# Patient Record
Sex: Female | Born: 1965
Health system: Southern US, Community
[De-identification: ages and names within clinical notes are randomized; demographics above are authoritative.]

## PROBLEM LIST (undated history)

## (undated) DIAGNOSIS — N390 Urinary tract infection, site not specified: Secondary | ICD-10-CM

## (undated) DIAGNOSIS — R519 Headache, unspecified: Secondary | ICD-10-CM

## (undated) DIAGNOSIS — Z8489 Family history of other specified conditions: Secondary | ICD-10-CM

## (undated) DIAGNOSIS — R51 Headache: Secondary | ICD-10-CM

## (undated) DIAGNOSIS — Z9889 Other specified postprocedural states: Secondary | ICD-10-CM

## (undated) DIAGNOSIS — R112 Nausea with vomiting, unspecified: Secondary | ICD-10-CM

## (undated) DIAGNOSIS — T4145XA Adverse effect of unspecified anesthetic, initial encounter: Secondary | ICD-10-CM

## (undated) DIAGNOSIS — T8859XA Other complications of anesthesia, initial encounter: Secondary | ICD-10-CM

## (undated) DIAGNOSIS — G610 Guillain-Barre syndrome: Secondary | ICD-10-CM

## (undated) HISTORY — DX: Guillain-Barre syndrome: G61.0

## (undated) HISTORY — DX: Urinary tract infection, site not specified: N39.0

---

## 1983-02-04 HISTORY — PX: TONSILLECTOMY: SUR1361

## 1998-06-04 DIAGNOSIS — G610 Guillain-Barre syndrome: Secondary | ICD-10-CM | POA: Insufficient documentation

## 1998-06-04 HISTORY — DX: Guillain-Barre syndrome: G61.0

## 1999-11-15 ENCOUNTER — Encounter: Payer: Self-pay | Admitting: Family Medicine

## 1999-11-15 LAB — CONVERTED CEMR LAB
Blood Glucose, Fasting: 88 mg/dL
RBC count: 4.66 10*6/uL
WBC, blood: 5.3 10*3/uL

## 1999-11-24 ENCOUNTER — Encounter: Payer: Self-pay | Admitting: Family Medicine

## 1999-11-24 LAB — CONVERTED CEMR LAB: TSH: 2.29 microintl units/mL

## 2001-04-08 ENCOUNTER — Encounter: Payer: Self-pay | Admitting: Family Medicine

## 2001-04-08 LAB — CONVERTED CEMR LAB
Blood Glucose, Fasting: 92 mg/dL
RBC count: 4.59 10*6/uL
TSH: 1.38 microintl units/mL
WBC, blood: 7.6 10*3/uL

## 2002-01-13 ENCOUNTER — Encounter: Payer: Self-pay | Admitting: Family Medicine

## 2002-01-13 LAB — CONVERTED CEMR LAB: Blood Glucose, Fasting: 93 mg/dL

## 2003-11-09 ENCOUNTER — Other Ambulatory Visit: Admission: RE | Admit: 2003-11-09 | Discharge: 2003-11-09 | Payer: Self-pay | Admitting: Family Medicine

## 2003-11-20 ENCOUNTER — Encounter: Payer: Self-pay | Admitting: Family Medicine

## 2003-11-20 LAB — CONVERTED CEMR LAB
Blood Glucose, Fasting: 94 mg/dL
TSH: 3.12 microintl units/mL

## 2003-12-29 ENCOUNTER — Ambulatory Visit: Payer: Self-pay | Admitting: Internal Medicine

## 2004-01-15 ENCOUNTER — Encounter: Payer: Self-pay | Admitting: Family Medicine

## 2004-01-15 LAB — CONVERTED CEMR LAB: Pap Smear: NORMAL

## 2004-02-08 ENCOUNTER — Ambulatory Visit: Payer: Self-pay | Admitting: Family Medicine

## 2004-02-29 ENCOUNTER — Ambulatory Visit: Payer: Self-pay | Admitting: Family Medicine

## 2004-03-15 ENCOUNTER — Ambulatory Visit: Payer: Self-pay | Admitting: Family Medicine

## 2004-03-20 ENCOUNTER — Ambulatory Visit: Payer: Self-pay | Admitting: Family Medicine

## 2004-12-25 ENCOUNTER — Ambulatory Visit: Payer: Self-pay | Admitting: Family Medicine

## 2005-03-20 ENCOUNTER — Ambulatory Visit: Payer: Self-pay | Admitting: Family Medicine

## 2005-07-25 ENCOUNTER — Ambulatory Visit: Payer: Self-pay | Admitting: Family Medicine

## 2005-07-28 ENCOUNTER — Ambulatory Visit: Payer: Self-pay | Admitting: Family Medicine

## 2005-07-28 LAB — HM MAMMOGRAPHY: HM Mammogram: NORMAL

## 2005-12-26 ENCOUNTER — Ambulatory Visit: Payer: Self-pay | Admitting: Family Medicine

## 2006-10-06 ENCOUNTER — Ambulatory Visit: Payer: Self-pay | Admitting: Family Medicine

## 2006-10-06 LAB — CONVERTED CEMR LAB
Bilirubin Urine: NEGATIVE
Glucose, Urine, Semiquant: NEGATIVE
Ketones, urine, test strip: NEGATIVE
Nitrite: NEGATIVE
Protein, U semiquant: NEGATIVE
Specific Gravity, Urine: 1.02
Urobilinogen, UA: NEGATIVE
pH: 6

## 2007-03-26 ENCOUNTER — Ambulatory Visit: Payer: Self-pay | Admitting: Family Medicine

## 2007-03-26 DIAGNOSIS — J019 Acute sinusitis, unspecified: Secondary | ICD-10-CM | POA: Insufficient documentation

## 2007-03-30 ENCOUNTER — Encounter: Payer: Self-pay | Admitting: Family Medicine

## 2007-06-30 ENCOUNTER — Ambulatory Visit: Payer: Self-pay | Admitting: Family Medicine

## 2007-06-30 DIAGNOSIS — M79609 Pain in unspecified limb: Secondary | ICD-10-CM | POA: Insufficient documentation

## 2007-06-30 LAB — CONVERTED CEMR LAB
Protein, U semiquant: 30
Specific Gravity, Urine: 1.02
pH: 5

## 2007-07-01 ENCOUNTER — Encounter: Payer: Self-pay | Admitting: Family Medicine

## 2007-11-19 ENCOUNTER — Ambulatory Visit: Payer: Self-pay | Admitting: Family Medicine

## 2007-11-19 DIAGNOSIS — E669 Obesity, unspecified: Secondary | ICD-10-CM | POA: Insufficient documentation

## 2007-11-19 DIAGNOSIS — J069 Acute upper respiratory infection, unspecified: Secondary | ICD-10-CM | POA: Insufficient documentation

## 2007-12-08 ENCOUNTER — Telehealth (INDEPENDENT_AMBULATORY_CARE_PROVIDER_SITE_OTHER): Payer: Self-pay | Admitting: Internal Medicine

## 2007-12-09 ENCOUNTER — Telehealth (INDEPENDENT_AMBULATORY_CARE_PROVIDER_SITE_OTHER): Payer: Self-pay | Admitting: *Deleted

## 2008-03-03 ENCOUNTER — Telehealth (INDEPENDENT_AMBULATORY_CARE_PROVIDER_SITE_OTHER): Payer: Self-pay | Admitting: Internal Medicine

## 2008-03-03 ENCOUNTER — Ambulatory Visit: Payer: Self-pay | Admitting: Family Medicine

## 2008-03-03 LAB — CONVERTED CEMR LAB
Bilirubin Urine: NEGATIVE
Glucose, Urine, Semiquant: NEGATIVE
Ketones, urine, test strip: NEGATIVE
Nitrite: POSITIVE
Protein, U semiquant: 30
Specific Gravity, Urine: <1.005
Urobilinogen, UA: 0.2
pH: 7

## 2008-07-24 ENCOUNTER — Ambulatory Visit: Payer: Self-pay | Admitting: Family Medicine

## 2008-07-24 LAB — CONVERTED CEMR LAB
Bilirubin Urine: NEGATIVE
Glucose, Urine, Semiquant: NEGATIVE
Ketones, urine, test strip: NEGATIVE
Nitrite: NEGATIVE
Protein, U semiquant: 30
Specific Gravity, Urine: 1.02
Urobilinogen, UA: 0.2
pH: 6

## 2008-07-27 ENCOUNTER — Telehealth: Payer: Self-pay | Admitting: Family Medicine

## 2008-08-21 ENCOUNTER — Ambulatory Visit: Payer: Self-pay | Admitting: Family Medicine

## 2008-08-21 LAB — CONVERTED CEMR LAB
Bilirubin Urine: NEGATIVE
Blood in Urine, dipstick: NEGATIVE
Glucose, Urine, Semiquant: NEGATIVE
Ketones, urine, test strip: NEGATIVE
Nitrite: NEGATIVE
Specific Gravity, Urine: <1.005
Urobilinogen, UA: 0.2
pH: 7

## 2008-08-22 ENCOUNTER — Encounter: Payer: Self-pay | Admitting: Family Medicine

## 2008-09-04 ENCOUNTER — Telehealth: Payer: Self-pay | Admitting: Family Medicine

## 2008-09-04 ENCOUNTER — Ambulatory Visit: Payer: Self-pay | Admitting: Family Medicine

## 2008-09-04 LAB — CONVERTED CEMR LAB
Bacteria, UA: 0
Bilirubin Urine: NEGATIVE
Blood in Urine, dipstick: NEGATIVE
Glucose, Urine, Semiquant: NEGATIVE
Ketones, urine, test strip: NEGATIVE
Nitrite: NEGATIVE
Protein, U semiquant: NEGATIVE
RBC / HPF: 0
Specific Gravity, Urine: 1.015
Urobilinogen, UA: 0.2
WBC Urine, dipstick: NEGATIVE
pH: 6

## 2008-09-05 ENCOUNTER — Encounter: Payer: Self-pay | Admitting: Family Medicine

## 2008-10-06 ENCOUNTER — Encounter (INDEPENDENT_AMBULATORY_CARE_PROVIDER_SITE_OTHER): Payer: Self-pay | Admitting: Internal Medicine

## 2008-10-06 ENCOUNTER — Telehealth (INDEPENDENT_AMBULATORY_CARE_PROVIDER_SITE_OTHER): Payer: Self-pay | Admitting: Internal Medicine

## 2008-10-06 LAB — CONVERTED CEMR LAB
Bilirubin Urine: NEGATIVE
Blood in Urine, dipstick: NEGATIVE
Glucose, Urine, Semiquant: NEGATIVE
Ketones, urine, test strip: NEGATIVE
Nitrite: POSITIVE
Protein, U semiquant: 30
Specific Gravity, Urine: 1.01
Urobilinogen, UA: 0.2
pH: 7

## 2008-10-07 ENCOUNTER — Encounter: Payer: Self-pay | Admitting: Family Medicine

## 2008-10-10 ENCOUNTER — Ambulatory Visit: Payer: Self-pay | Admitting: Family Medicine

## 2009-01-02 ENCOUNTER — Telehealth: Payer: Self-pay | Admitting: Family Medicine

## 2009-01-16 ENCOUNTER — Ambulatory Visit: Payer: Self-pay | Admitting: Family Medicine

## 2009-01-16 DIAGNOSIS — M25519 Pain in unspecified shoulder: Secondary | ICD-10-CM | POA: Insufficient documentation

## 2009-01-16 LAB — CONVERTED CEMR LAB
Bilirubin Urine: NEGATIVE
Glucose, Urine, Semiquant: NEGATIVE
Ketones, urine, test strip: NEGATIVE
Nitrite: NEGATIVE
Protein, U semiquant: NEGATIVE
Specific Gravity, Urine: 1.015
Urobilinogen, UA: 0.2
pH: 6

## 2009-02-06 ENCOUNTER — Telehealth: Payer: Self-pay | Admitting: Family Medicine

## 2009-03-09 ENCOUNTER — Ambulatory Visit: Payer: Self-pay | Admitting: Family Medicine

## 2009-03-09 DIAGNOSIS — J111 Influenza due to unidentified influenza virus with other respiratory manifestations: Secondary | ICD-10-CM | POA: Insufficient documentation

## 2009-03-09 LAB — CONVERTED CEMR LAB: Rapid Strep: NEGATIVE

## 2009-06-06 ENCOUNTER — Ambulatory Visit: Payer: Self-pay | Admitting: Family Medicine

## 2009-06-06 LAB — CONVERTED CEMR LAB
Bilirubin Urine: NEGATIVE
Glucose, Urine, Semiquant: NEGATIVE
Ketones, urine, test strip: NEGATIVE
Nitrite: POSITIVE
Protein, U semiquant: 100
Specific Gravity, Urine: 1.02
Urobilinogen, UA: 0.2
pH: 6

## 2009-06-07 ENCOUNTER — Encounter: Payer: Self-pay | Admitting: Family Medicine

## 2009-07-05 ENCOUNTER — Telehealth: Payer: Self-pay | Admitting: Family Medicine

## 2009-09-06 ENCOUNTER — Encounter (INDEPENDENT_AMBULATORY_CARE_PROVIDER_SITE_OTHER): Payer: Self-pay | Admitting: *Deleted

## 2009-11-09 ENCOUNTER — Ambulatory Visit: Payer: Self-pay | Admitting: Family Medicine

## 2009-11-09 DIAGNOSIS — N390 Urinary tract infection, site not specified: Secondary | ICD-10-CM | POA: Insufficient documentation

## 2009-11-09 LAB — CONVERTED CEMR LAB
Bilirubin Urine: NEGATIVE
Casts: 0 /lpf
Glucose, Urine, Semiquant: NEGATIVE
Ketones, urine, test strip: NEGATIVE
Nitrite: POSITIVE
Specific Gravity, Urine: 1.015
Urobilinogen, UA: 0.2
Yeast, UA: 0
pH: 6

## 2009-11-10 ENCOUNTER — Encounter: Payer: Self-pay | Admitting: Family Medicine

## 2010-01-16 ENCOUNTER — Telehealth: Payer: Self-pay | Admitting: Family Medicine

## 2010-03-06 NOTE — Progress Notes (Signed)
Summary: Rx Diclofenac  Phone Note Refill Request Call back at [phone] Message [facility]:  [facility] on July 05, 2009 8:14 AM  Refills Requested: Medication #1:  VOLTAREN 75 MG TBEC Take 1 tablet by mouth two times a day as needed Received e-scribe refill request.   Method Requested: Electronic Initial call taken by: Sydell Axon LPN,  July 06, 2954 8:15 AM    New/Updated Medications: VOLTAREN 75 MG TBEC (DICLOFENAC SODIUM) Take 1 tablet by mouth two times a day as needed sparingly Prescriptions: VOLTAREN 75 MG TBEC (DICLOFENAC SODIUM) Take 1 tablet by mouth two times a day as needed sparingly  #60 x 0   Entered and Authorized by:   Shaune Leeks MD   Signed by:   Shaune Leeks MD on 07/05/2009   Method used:   Electronically to        [facility] [phone]* (retail)       [address] [place], [place]  [address]       Ph: [phone] or [phone]       Fax: [phone]   RxID:   [id number]

## 2010-03-06 NOTE — Miscellaneous (Signed)
Summary: repeat culture  Clinical Lists Changes  Orders: Added new Test order of T-Culture, Urine ([id number]) - Signed Added new Service order of UA Dipstick W/ Micro (manual) (29562) - Signed

## 2010-03-06 NOTE — Progress Notes (Signed)
Summary: Voltaren  Phone Note Refill Request Message from:  [facility] on December 09, 2007 2:10 PM  Refills Requested: Medication #1:  VOLTAREN 75 MG TBEC Take 1 tablet by mouth two times a day as needed [facility] [address] [place]  Phone:   [phone]   Method Requested: Electronic Initial call taken by: Delilah Shan,  December 09, 2007 2:11 PM  Follow-up for Phone Call        this was sent early this morning--did they not get ir?  Billie-Lynn Tyler Deis FNP  December 09, 2007 4:59 PM   Additional Follow-up for Phone Call Additional follow up Details #1::        Medication phoned to pharmacy.  Additional Follow-up by: Delilah Shan,  December 09, 2007 5:12 PM

## 2010-03-06 NOTE — Progress Notes (Signed)
Summary: refill request for diclofenac  Phone Note Refill Request Message from:  [facility]  Refills Requested: Medication #1:  VOLTAREN 75 MG TBEC Take 1 tablet by mouth two times a day as needed   Last Refilled: 11/09/2007 [facility]  Initial call taken by: Lowella Petties,  December 08, 2007 5:18 PM  Follow-up for Phone Call        Rx sent electronically   Billie-Lynn Tyler Deis FNP  December 09, 2007 6:19 AM       Prescriptions: VOLTAREN 75 MG TBEC (DICLOFENAC SODIUM) Take 1 tablet by mouth two times a day as needed  #60 x 1   Entered and Authorized by:   Gildardo Griffes FNP   Signed by:   Gildardo Griffes FNP on 12/09/2007   Method used:   Electronically to        [facility] [phone]* (retail)       [address] [place], [place]  [address]       Ph: [phone] or [phone]       Fax: [phone]   RxID:   [id number]

## 2010-03-06 NOTE — Assessment & Plan Note (Signed)
Summary: BLADDAR INFECTION / LFW   Vital Signs:  Patient Profile:   45 Years Old Female Height:     65 inches (165.1 cm) Weight:      219 pounds Temp:     98.3 degrees F oral Pulse rate:   64 / minute Pulse rhythm:   regular BP sitting:   110 / 60  (left arm) Cuff size:   large  Vitals Entered ByProvidence Crosby (Jun 30, 2007 12:36 PM)                 Chief Complaint:  ? uti.  History of Present Illness: Started with mild urinary burning and discomfort over the weekend. Last night had significant hurting in bladder area with standing, no fever or chills. Bothered her significantly. Last UTI almost a year ago. Was put on Levaquin then. Has had sinus infection since, given Amox. Has been good since Feb unil now. Has been walking on the treadmill regularly 1/2hour a day for some time now and has lost considerable weight, 244 to today's 219. However her heel has begun actingup. Had symptomatic heel spur years ago for which she had injection or two and Lodine. She has not had problems since until now.She is requesting Lodine to try.    Prior Medications Reviewed Using: Patient Recall  Current Allergies (reviewed today): ! * SULFA ! * MACRODANTIN      Physical Exam  General:     Well-developed,well-nourished,in no acute distress; alert,appropriate and cooperative throughout examination Head:     Normocephalic and atraumatic without obvious abnormalities. No apparent alopecia or balding. Eyes:     Conjunctiva clear bilaterally.  Ears:     External ear exam shows no significant lesions or deformities.  Otoscopic examination reveals clear canals, tympanic membranes are intact bilaterally without bulging, retraction, inflammation or discharge. Hearing is grossly normal bilaterally. Nose:     External nasal examination shows no deformity or inflammation. Nasal mucosa are pink and moist without lesions or exudates. Mouth:     Oral mucosa and oropharynx without lesions or  exudates.  Teeth in good repair. Abdomen:     mildly tender in suprapubic area. Msk:     -CVAT  Extremities:     Heel nontender to palpation but hurts with weight bearing.    Impression & Recommendations:  Problem # 1:  URINARY TRACT INFECTION, RECURRENT FREQUENTLY (ICD-599.0) Assessment: Unchanged  Her updated medication list for this problem includes:    Cipro 500 Mg Tabs (Ciprofloxacin hcl) ..... One tab by mouth bid  Orders: T-Urine Culture ([facility]rum Order) [id number])   Problem # 2:  HEEL PAIN (ICD-729.5) Assessment: New Recurrent. Trial of Etodolac.  Complete Medication List: 1)  Cystech 500 Mg Caps (Cysteine) .Marland Kitchen.. 1 as needed 2)  Cipro 500 Mg Tabs (Ciprofloxacin hcl) .... One tab by mouth bid 3)  Etodolac 400 Mg Tabs (Etodolac) .... One tab by mouth two times a day with food   Patient Instructions: 1)  RTC , call if sxs don't improve.   Prescriptions: ETODOLAC 400 MG  TABS (ETODOLAC) one tab by mouth two times a day with food  #60 x 0   Entered and Authorized by:   Shaune Leeks MD   Signed by:   Shaune Leeks MD on 06/30/2007   Method used:   Print then Give to Patient   RxID:   [id number] CIPRO 500 MG  TABS (CIPROFLOXACIN HCL) one tab by mouth bid  #20 x 0  Entered and Authorized by:   Shaune Leeks MD   Signed by:   Shaune Leeks MD on 06/30/2007   Method used:   Print then Give to Patient   RxID:   [id number]  ] Laboratory Results   Urine Tests  Date/Time Recieved: Jun 30, 2007 12:37 PM Date/Time Reported: Jun 30, 2007 12:37 PM  Routine Urinalysis   Color: orange Appearance: Hazy Glucose: ?   (Normal Range: Negative) Bilirubin: ?   (Normal Range: Negative) Ketone: ?   (Normal Range: Negative) Spec. Gravity: 1.020   (Normal Range: 1.003-1.035) Blood: trace-intact   (Normal Range: Negative) pH: 5.0   (Normal Range: 5.0-8.0) Protein: 30   (Normal Range: Negative) Urobilinogen: ?   (Normal Range: 0-1)  Nitrite: ?   (Normal Range: Negative) Leukocyte Esterace: large   (Normal Range: Negative)  Urine Microscopic WBC/hpf: TNTC RBC/hpf: 0-1 Bacteria: 3+ Epithelial: Rare    Comments: took medication

## 2010-03-06 NOTE — Progress Notes (Signed)
Summary: asking for abx for UTI  Phone Note Call from Patient Call back at [phone]   Caller: Patient Call For: Sherry Mendoza Summary of Call: Pt states she has cloudy urine, some pain, burning with urination since yesterday.  She is asking if you will call her in an abx.  I told her that usually this would not be done but that since there are no appts available today and since the weather is going to be bad tomorrow I would ask.  [facility]  She can drop off a urine after 3:15 if you want her to. Initial call taken by: Lowella Petties,  March 03, 2008 1:25 PM  Follow-up for Phone Call        she needs to be seen, can work her in before 4pm for UTI...will not tx on the phone.  can go to [employer] office tomorrow or Urgent care today.Marland KitchenMarland KitchenMarland KitchenBillie-Lynn Tyler Deis FNP  March 03, 2008 1:51 PM  Follow-up by: Providence Crosby,  March 03, 2008 2:13 PM  Additional Follow-up for Phone Call Additional follow up Details #1::        patient notified and appointment made

## 2010-03-06 NOTE — Assessment & Plan Note (Signed)
Summary: ?UTI/CLE   Vital Signs:  Patient profile:   45 year old female Height:      65 inches Weight:      210.75 pounds BMI:     35.20 Temp:     98.4 degrees F oral Pulse rate:   68 / minute Pulse rhythm:   regular BP sitting:   110 / 76  (left arm) Cuff size:   large  Vitals Entered By: Lewanda Rife LPN (November 09, 2009 12:43 PM) CC: Pain and burning upon urination  with frequency and nausea   History of Present Illness: likely uti has these frequently - 2 times per year (better since urethral dilatation)  last one in may was pan sensitive and e coli   pain and burning with urination - started this am  urine has a strong odor  a little nauseated no fever  low back is just a little sore  no blood in urine   has has urol workup in the past       Allergies: 1)  ! * Sulfa 2)  ! * Macrodantin  Past History:  Past Surgical History: Last updated: 03/30/2007 TONSILLECTOMY (18 YOA) NSVD x 2 1995  1998 HOSP GUILLIAN BARRE PLASMAPHORESIS 1 WEEK ([facility]) 06/1998  Family History: Last updated: 03/30/2007 Father:DOES NOT KNOW WAS ADOPTED  Mother: DOES NOT KNOW WAS ADOPTED Siblings: CV: ?  HBP: ? DM: +MGM GOUT/ARTHRITIS: PROSTATE CANCER: BREAST/OVARIAN/UTERINE CANCER: ? COLON CANCER: ? DEPRESSION: ETOH/DRUG ABUSE: OTHER: ? STROKES  Social History: Last updated: 03/30/2007 Marital Status: MarriedLIVES WITH HUSBAND Children: 2 Occupation: HOUSEWIFE// TEACHING AT [employer]  Risk Factors: Smoking Status: never (03/30/2007)  Past Medical History: frequent utis past (with urol w/u and urethral dilatation)  Review of Systems General:  Complains of fatigue; denies chills, fever, loss of appetite, and malaise. CV:  Denies chest pain or discomfort and lightheadness. Resp:  Denies cough and wheezing. GI:  Complains of nausea; denies abdominal pain, change in bowel habits, indigestion, and vomiting. GU:  Complains of dysuria and urinary  frequency; denies hematuria. MS:  Complains of low back pain; denies mid back pain. Derm:  Denies rash.  Physical Exam  General:  overweight but generally well appearing  Head:  normocephalic, atraumatic, and no abnormalities observed.   Mouth:  pharynx pink and moist.   Neck:  No deformities, masses, or tenderness noted. Lungs:  Normal respiratory effort, chest expands symmetrically. Lungs are clear to auscultation, no crackles or wheezes. Heart:  Normal rate and regular rhythm. S1 and S2 normal without gallop, murmur, click, rub or other extra sounds. Abdomen:  no suprapubic tenderness or fullness felt  Msk:  no CVA tenderness  Skin:  Intact without suspicious lesions or rashes Cervical Nodes:  No lymphadenopathy noted Inguinal Nodes:  No significant adenopathy Psych:  normal affect, talkative and pleasant    Impression & Recommendations:  Problem # 1:  UTI (ICD-599.0) Assessment New uncomplicated and recurrent in past - but less frequent lately  urine cx today  tx with cipro disc red flags to watch for  pt advised to update me if symptoms worsen or do not improve  The following medications were removed from the medication list:    Cipro 500 Mg Tabs (Ciprofloxacin hcl) ..... One tab by mouth two times a day Her updated medication list for this problem includes:    Cipro 250 Mg Tabs (Ciprofloxacin hcl) .Marland Kitchen... 1 by mouth two times a day for 7 days  Orders: T-Culture, Urine (  [id number]) UA Dipstick W/ Micro (manual) (09811) Prescription Created Electronically [phone])  Complete Medication List: 1)  Voltaren 75 Mg Tbec (Diclofenac sodium) .... Take 1 tablet by mouth two times a day as needed sparingly 2)  Cipro 250 Mg Tabs (Ciprofloxacin hcl) .Marland Kitchen.. 1 by mouth two times a day for 7 days  Patient Instructions: 1)  continue drinking lots of water 2)  call or seek care is symptoms don't improve in 2-3 days or if you develop back pain, nausea, or vomiting  3)  take the cipro as  directed  4)  I sent this to your pharmacy  5)  we will update you next week with urine culture results  Prescriptions: CIPRO 250 MG TABS (CIPROFLOXACIN HCL) 1 by mouth two times a day for 7 days  #14 x 0   Entered and Authorized by:   Judith Part MD   Signed by:   Judith Part MD on 11/09/2009   Method used:   [facility] to        [facility] [phone][facility] (retail)       [address] [place], [place]  [address]       Ph: [phone] or [phone]       Fax: [phone]   RxID:   [id number]   Current Allergies (reviewed today): ! * SULFA ! * MACRODANTIN  Laboratory Results   Urine Tests  Date/Time Received: November 09, 2009 12:45 PM  Date/Time Reported: November 09, 2009 12:45 PM   Routine Urinalysis   Color: yellow Appearance: Hazy Glucose: negative   (Normal Range: Negative) Bilirubin: negative   (Normal Range: Negative) Ketone: negative   (Normal Range: Negative) Spec. Gravity: 1.015   (Normal Range: 1.003-1.035) Blood: trace-intact   (Normal Range: Negative) pH: 6.0   (Normal Range: 5.0-8.0) Protein: trace   (Normal Range: Negative) Urobilinogen: 0.2   (Normal Range: 0-1) Nitrite: positive   (Normal Range: Negative) Leukocyte Esterace: small   (Normal Range: Negative)  Urine Microscopic WBC/HPF: many RBC/HPF: few Bacteria/HPF: many Mucous/HPF: few Epithelial/HPF: 1-2 Crystals/HPF: few Casts/LPF: 0 Yeast/HPF: 0 Other: 0        Appended Document: ?UTI/CLE

## 2010-03-06 NOTE — Assessment & Plan Note (Signed)
Summary: ?UTI/CLE   Vital Signs:  Patient Profile:   45 Years Old Female Weight:      244 pounds Temp:     98.7 degrees F oral Pulse rate:   65 / minute BP sitting:   128 / 99  (left arm) Cuff size:   large  Vitals Entered By: Cooper Render (October 06, 2006 4:00 PM)                 Chief Complaint:  uti.  History of Present Illness: Here due to dysuria x 4 d, did get better but back.  Nocturia x1, now urgency, frequency, no fever/chillsbut  hot/cold today.Last UTI  ~67yrs ago.Then saw Urology for bladder stem stretch. Does tub baths--nothing in the water, nothing else new. Using OTC bladder spasm med--helps.  Current Allergies (reviewed today): ! SULFA ! MACROBID     Review of Systems      See HPI   Physical Exam  General:     alert, well-developed, well-nourished, well-hydrated, and overweight-appearing.   Head:     normocephalic, atraumatic, and no abnormalities observed.   Eyes:     pupils equal, pupils round, and no injection.   Lungs:     normal respiratory effort.   Abdomen:     no CVA tenderness Neurologic:     alert & oriented X3, sensation intact to light touch, and gait normal.   Skin:     turgor normal and color normal.   Psych:     normally interactive and good eye contact.      Impression & Recommendations:  Problem # 1:  U T I (ICD-599.0)  The following medications were removed from the medication list:    Levaquin 500 Mg Tabs (Levofloxacin) .Marland Kitchen... 1 qd  Her updated medication list for this problem includes:    Cipro 500 Mg Tabs (Ciprofloxacin hcl) .Marland Kitchen... 1 bid  Orders: UA Dipstick W/ Micro ([address])   Complete Medication List: 1)  Cipro 500 Mg Tabs (Ciprofloxacin hcl) .Marland Kitchen.. 1 bid   Patient Instructions: 1)  reviewed plan with patient--agrees 2)  disscussed hygeine    Prescriptions: CIPRO 500 MG  TABS (CIPROFLOXACIN HCL) 1 bid  #14 x 0   Entered and Authorized by:   Gildardo Griffes FNP   Signed by:   Gildardo Griffes FNP on 10/06/2006   Method used:   Telephoned to ...         RxID:   [id number] LEVAQUIN 500 MG  TABS (LEVOFLOXACIN) 1 qd  #7 x 0   Entered and Authorized by:   Gildardo Griffes FNP   Signed by:   Gildardo Griffes FNP on 10/06/2006   Method used:   Print then Give to Patient   RxID:   [id number]   Laboratory Results   Urine Tests  Date/Time Recieved: 10/06/06 Date/Time Reported: 10/06/06  Routine Urinalysis   Glucose: negative   (Normal Range: Negative) Bilirubin: negative   (Normal Range: Negative) Ketone: negative   (Normal Range: Negative) Spec. Gravity: 1.020   (Normal Range: 1.003-1.035) Blood: small   (Normal Range: Negative) pH: 6.0   (Normal Range: 5.0-8.0) Protein: negative   (Normal Range: Negative) Urobilinogen: negative   (Normal Range: 0-1) Nitrite: negative   (Normal Range: Negative) Leukocyte Esterace: small   (Normal Range: Negative)  Urine Microscopic WBC/hpf: 5-10 RBC/hpf: 15-20 Bacteria: trace Epithelial: rare

## 2010-03-06 NOTE — Progress Notes (Signed)
Summary: RX Diclofenac  Phone Note Refill Request Call back at [phone] Message [facility] on January 02, 2009 12:57 PM  Refills Requested: Medication #1:  VOLTAREN 75 MG TBEC Take 1 tablet by mouth two times a day as needed   Last Refilled: 09/04/2008 Received faxed refill request.   Method Requested: Electronic Initial call taken by: Sydell Axon LPN,  January 02, 2009 12:58 PM    Prescriptions: VOLTAREN 75 MG TBEC (DICLOFENAC SODIUM) Take 1 tablet by mouth two times a day as needed  #60 Tablet x 0   Entered and Authorized by:   Shaune Leeks MD   Signed by:   Shaune Leeks MD on 01/02/2009   Method used:   Electronically to        [facility] [phone]* (retail)       [address] [place], [place]  [address]       Ph: [phone] or [phone]       Fax: [phone]   RxID:   [id number]

## 2010-03-06 NOTE — Assessment & Plan Note (Signed)
Summary: ?UTI/CLE   Vital Signs:  Patient profile:   45 year old female Weight:      218 pounds Temp:     98.4 degrees F oral Pulse rate:   60 / minute Pulse rhythm:   regular BP sitting:   120 / 84  (left arm) Cuff size:   large  Vitals Entered By: Sydell Axon LPN (January 16, 2009 3:52 PM) CC: ? UTI, lower abd pain, burning with urination   History of Present Illness: Pt here for poss UTI. She has had UTI sxs not infrequently in the past and started with burning and pain last night which is now essentially resolved. Again she has had frequent infections. Her last infection was Enterobacter treated with Levaquin 9/10. She had sxs resolve with that trmt and had done well since. She has taken Cistex since last night. She denies fever or chills, no back pain presently but had some last night and no current suprapubic pain  or dysuria. She also has had a point of significant pain on the top of the right shoulder behind the A/C joint. She wondered if it could be from carrying a very heavy pocketbook that causes pain there and radiates to the right side of the posterior skull.   Problems Prior to Update: 1)  Obesity  (ICD-278.00) 2)  Uri  (ICD-465.9) 3)  Heel Pain  (ICD-729.5) 4)  Urinary Tract Infection, Recurrent Frequently  (ICD-599.0) 5)  Sinusitis- Acute-nos  (ICD-461.9)  Medications Prior to Update: 1)  Voltaren 75 Mg Tbec (Diclofenac Sodium) .... Take 1 Tablet By Mouth Two Times A Day As Needed 2)  Fish Oil   Oil (Fish Oil) .... One Daily 3)  Ciprofloxacin Hcl 500 Mg Tabs (Ciprofloxacin Hcl) .... One Tab By Mouth Two Times A Day 4)  Levaquin 750 Mg Tabs (Levofloxacin) .Marland Kitchen.. 1 Once Daily For Uti  Allergies: 1)  ! * Sulfa 2)  ! * Macrodantin  Physical Exam  General:  alert, well-developed, well-nourished, and well-hydrated, mildly overweight, nontoxic appearing. Head:  Normocephalic and atraumatic without obvious abnormalities. No apparent alopecia or balding. Eyes:   Conjunctiva clear bilaterally.  Ears:  External ear exam shows no significant lesions or deformities.  Otoscopic examination reveals clear canals, tympanic membranes are intact bilaterally without bulging, retraction, inflammation or discharge. Hearing is grossly normal bilaterally. Nose:  External nasal examination shows no deformity or inflammation. Nasal mucosa are pink and moist without lesions or exudates. Mouth:  no exudates and pharyngeal erythema.   Neck:  No deformities, masses, or tenderness noted. Lungs:  Normal respiratory effort, chest expands symmetrically. Lungs are clear to auscultation, no crackles or wheezes. Heart:  Normal rate and regular rhythm. S1 and S2 normal without gallop, murmur, click, rub or other extra sounds. Abdomen:  No suprpubic pain to palpation. Msk:  No CVAT. Point tenderness to palpation over the posterior point of the A/C joint of the right shoulder and lesser involvement of the insertion site of the right upper trapezial fibers on the skull.   Impression & Recommendations:  Problem # 1:  URINARY TRACT INFECTION, RECURRENT FREQUENTLY (ICD-599.0) Assessment Deteriorated Poss recurrent. U/A looks benign. Will send for culture. Cystex may have changed a mild infection. Will follow. The following medications were removed from the medication list:    Ciprofloxacin Hcl 500 Mg Tabs (Ciprofloxacin hcl) ..... One tab by mouth two times a day    Levaquin 750 Mg Tabs (Levofloxacin) .Marland Kitchen... 1 once daily for uti  Orders: Specimen Handling (  99000) T-Culture, Urine ([id number])  Problem # 2:  SHOULDER PAIN, RIGHT (ICD-719.41) Assessment: New Will presume acute pain from heavy pocketbook with assoc radiation to skull. Avoid carrying pocketbook for a while. Her updated medication list for this problem includes:    Voltaren 75 Mg Tbec (Diclofenac sodium) .Marland Kitchen... Take 1 tablet by mouth two times a day as needed  Complete Medication List: 1)  Voltaren 75 Mg Tbec  (Diclofenac sodium) .... Take 1 tablet by mouth two times a day as needed  Other Orders: UA Dipstick W/ Micro (manual) ([address])  Patient Instructions: 1)  Will report culture results.  Current Allergies (reviewed today): ! * SULFA ! * MACRODANTIN  Laboratory Results   Urine Tests  Date/Time Received: January 16, 2009 4:05 PM  Date/Time Reported: January 16, 2009 4:05 PM   Routine Urinalysis   Color: yellow Appearance: Cloudy Glucose: negative   (Normal Range: Negative) Bilirubin: negative   (Normal Range: Negative) Ketone: negative   (Normal Range: Negative) Spec. Gravity: 1.015   (Normal Range: 1.003-1.035) Blood: trace-lysed   (Normal Range: Negative) pH: 6.0   (Normal Range: 5.0-8.0) Protein: negative   (Normal Range: Negative) Urobilinogen: 0.2   (Normal Range: 0-1) Nitrite: negative   (Normal Range: Negative) Leukocyte Esterace: small   (Normal Range: Negative)

## 2010-03-06 NOTE — Assessment & Plan Note (Signed)
Summary: reculture urine/whc  Nurse Visit   Allergies: 1)  ! * Sulfa 2)  ! * Macrodantin Laboratory Results   Urine Tests  Date/Time Received: September 04, 2008 11:25 AM Date/Time Reported: September 04, 2008 11:25 AM  Routine Urinalysis   Color: yellow Appearance: Clear Glucose: negative   (Normal Range: Negative) Bilirubin: negative   (Normal Range: Negative) Ketone: negative   (Normal Range: Negative) Spec. Gravity: 1.015   (Normal Range: 1.003-1.035) Blood: negative   (Normal Range: Negative) pH: 6.0   (Normal Range: 5.0-8.0) Protein: negative   (Normal Range: Negative) Urobilinogen: 0.2   (Normal Range: 0-1) Nitrite: negative   (Normal Range: Negative) Leukocyte Esterace: negative   (Normal Range: Negative)  Urine Microscopic WBC/HPF: 0-1 RBC/HPF: 0 Bacteria/HPF: 0 Epithelial/HPF: Rare    Comments: recultured urine    Orders Added: 1)  T-Culture, Urine [[id number]] 2)  UA Dipstick W/ Micro (manual) [81000]  Laboratory Results   Urine Tests    Routine Urinalysis   Color: yellow Appearance: Clear Glucose: negative   (Normal Range: Negative) Bilirubin: negative   (Normal Range: Negative) Ketone: negative   (Normal Range: Negative) Spec. Gravity: 1.015   (Normal Range: 1.003-1.035) Blood: negative   (Normal Range: Negative) pH: 6.0   (Normal Range: 5.0-8.0) Protein: negative   (Normal Range: Negative) Urobilinogen: 0.2   (Normal Range: 0-1) Nitrite: negative   (Normal Range: Negative) Leukocyte Esterace: negative   (Normal Range: Negative)  Urine Microscopic WBC/hpf: 0-1 RBC/hpf: 0 Bacteria: 0 Epithelial: Rare    Comments: recultured urine

## 2010-03-06 NOTE — Progress Notes (Signed)
Summary: Yeast infection  Phone Note Call from Patient Call back at [phone]   Caller: Patient Call For: Shaune Leeks MD Summary of Call: Patient has an UTI and was given Keflex on Monday.  Patient has a yeast infection and has stopped taking this now, but plans on going back on the Keflex.  What is the best way to prevent a yeast infection while on an antibiotic?  Patient has been eating yogurt and did an OTC medication and is some better.  You will not be able to be reached today, but you can leave her a message at the above number. Initial call taken by: Sydell Axon,  July 27, 2008 3:22 PM  Follow-up for Phone Call        Is doing everything I know of. Take Diflucan 150mg  now and at the end of the Ab regimen. #2/0rf Follow-up by: Shaune Leeks MD,  July 27, 2008 5:09 PM  Additional Follow-up for Phone Call Additional follow up Details #1::        Patient notified as instructed by telephone.  [facility] [address] [facility]'s request. Additional Follow-up by: Sydell Axon,  July 27, 2008 5:18 PM

## 2010-03-06 NOTE — Assessment & Plan Note (Signed)
Summary: ?UTI/MK   Vital Signs:  Patient profile:   45 year old female Height:      65 inches Weight:      212.25 pounds BMI:     35.45 Temp:     98.5 degrees F oral Pulse rate:   72 / minute Pulse rhythm:   regular BP sitting:   124 / 80  (left arm) Cuff size:   large  Vitals Entered By: Lewanda Rife (July 24, 2008 3:57 PM)  History of Present Illness: Pt here for sxs of burning and pain, mildly last week and then went away but has returned this AM and now is significant. She has not had fever or chills. She is feeling ok otherwise.  Problems Prior to Update: 1)  Obesity  (ICD-278.00) 2)  Uri  (ICD-465.9) 3)  Heel Pain  (ICD-729.5) 4)  Urinary Tract Infection, Recurrent Frequently  (ICD-599.0) 5)  Sinusitis- Acute-nos  (ICD-461.9)  Medications Prior to Update: 1)  Voltaren 75 Mg Tbec (Diclofenac Sodium) .... Take 1 Tablet By Mouth Two Times A Day As Needed 2)  Cipro 500 Mg Tabs (Ciprofloxacin Hcl) .Marland Kitchen.. 1 Two Times A Day By Mouth  Allergies: 1)  ! * Sulfa 2)  ! * Macrodantin  Physical Exam  General:  alert, well-developed, well-nourished, and well-hydrated, mildly overweight. Head:  Normocephalic and atraumatic without obvious abnormalities. No apparent alopecia or balding. Eyes:  Conjunctiva clear bilaterally.  Ears:  External ear exam shows no significant lesions or deformities.  Otoscopic examination reveals clear canals, tympanic membranes are intact bilaterally without bulging, retraction, inflammation or discharge. Hearing is grossly normal bilaterally. Nose:  External nasal examination shows no deformity or inflammation. Nasal mucosa are pink and moist without lesions or exudates. Mouth:  no exudates and pharyngeal erythema.   Neck:  No deformities, masses, or tenderness noted. Lungs:  Normal respiratory effort, chest expands symmetrically. Lungs are clear to auscultation, no crackles or wheezes. Heart:  Normal rate and regular rhythm. S1 and S2 normal without  gallop, murmur, click, rub or other extra sounds. Abdomen:  Mild suprapubic tenderness to direct palpation. Msk:  No CVAT.   Impression & Recommendations:  Problem # 1:  URINARY TRACT INFECTION, RECURRENT FREQUENTLY (ICD-599.0) Assessment Deteriorated  Recurrent. Start Keflex as was on CIpro last. Cakll if sxs worsen. The following medications were removed from the medication list:    Cipro 500 Mg Tabs (Ciprofloxacin hcl) .Marland Kitchen... 1 two times a day by mouth Her updated medication list for this problem includes:    Keflex 250 Mg Caps (Cephalexin) ..... One tab by mouth four times a day.  Encouraged to push clear liquids, get enough rest, and take acetaminophen as needed. To be seen in 10 days if no improvement, sooner if worse.  Orders: UA Dipstick W/ Micro (manual) ([address])  Complete Medication List: 1)  Voltaren 75 Mg Tbec (Diclofenac sodium) .... Take 1 tablet by mouth two times a day as needed 2)  Fish Oil Oil (Fish oil) .... One daily 3)  Keflex 250 Mg Caps (Cephalexin) .... One tab by mouth four times a day. Prescriptions: KEFLEX 250 MG CAPS (CEPHALEXIN) one tab by mouth four times a day.  #40 x 0   Entered and Authorized by:   Shaune Leeks MD   Signed by:   Shaune Leeks MD on 07/24/2008   Method used:   [facility]        [facility] [phone][facility] (retail)  [address]       [place], [place]  [address]       Ph: [phone] or [phone]       Fax: [phone]   RxID:   [id number]   Current Allergies (reviewed today): ! * SULFA ! * MACRODANTIN  Laboratory Results   Urine Tests  Date/Time Received: July 24, 2008 3:59 PM  Date/Time Reported: July 24, 2008 3:59 PM   Routine Urinalysis   Color: yellow Appearance: Cloudy Glucose: negative   (Normal Range: Negative) Bilirubin: negative   (Normal Range: Negative) Ketone: negative   (Normal Range: Negative) Spec. Gravity: 1.020   (Normal Range: 1.003-1.035) Blood:  small   (Normal Range: Negative) pH: 6.0   (Normal Range: 5.0-8.0) Protein: 30   (Normal Range: Negative) Urobilinogen: 0.2   (Normal Range: 0-1) Nitrite: negative   (Normal Range: Negative) Leukocyte Esterace: small   (Normal Range: Negative)  Urine Microscopic WBC/HPF: 10-15 RBC/HPF: 0-1 Bacteria/HPF: 1+ Epithelial/HPF: Rare

## 2010-03-06 NOTE — Assessment & Plan Note (Signed)
Summary: UT/RBH   Vital Signs:  Patient profile:   45 year old female Weight:      204.8 pounds Temp:     98.6 degrees F oral Pulse rate:   72 / minute Pulse rhythm:   regular BP sitting:   120 / 84  (left arm) Cuff size:   large  Vitals Entered By: Sydell Axon LPN (Jun 06, 1608 11:25 AM) CC: ? UTI, lower abd pressure, burning with urination, urgency and frequency   History of Present Illness: Pt here for UTI sxs with burning and discomfort. Her sxs started Sun and has gotten worse. She denies fever and is urinating normally but with pain. She has taken Cystex Sun, helped and took IBP yest at work.  Problems Prior to Update: 1)  Influenza Like Illness  (ICD-487.1) 2)  Shoulder Pain, Right  (ICD-719.41) 3)  Obesity  (ICD-278.00) 4)  Uri  (ICD-465.9) 5)  Heel Pain  (ICD-729.5) 6)  Urinary Tract Infection, Recurrent Frequently  (ICD-599.0) 7)  Sinusitis- Acute-nos  (ICD-461.9)  Medications Prior to Update: 1)  Voltaren 75 Mg Tbec (Diclofenac Sodium) .... Take 1 Tablet By Mouth Two Times A Day As Needed 2)  Tamiflu 75 Mg Caps (Oseltamivir Phosphate) .Marland Kitchen.. 1 Tab By Mouth Two Times A Day X 5 3)  Cheratussin Ac 100-10 Mg/40ml Syrp (Guaifenesin-Codeine) .Marland Kitchen.. 1-2 Tsp By Mouth At Bedtime As Needed Cough  Allergies: 1)  ! * Sulfa 2)  ! * Macrodantin  Physical Exam  General:  fatigued appearing female in NAD, comfortable and nontoxic. Abdomen:  No suprapubic tenderness. Msk:  No CVAT.   Impression & Recommendations:  Problem # 1:  URINARY TRACT INFECTION, RECURRENT FREQUENTLY (ICD-599.0) Assessment Deteriorated  Recuurent. Will culture. Start Cipro for two weeks to preclude frequent recurrence. Will use Cystex. Her updated medication list for this problem includes:    Cipro 500 Mg Tabs (Ciprofloxacin hcl) ..... One tab by mouth two times a day  Encouraged to push clear liquids, get enough rest, and take acetaminophen as needed. To be seen in 10 days if no improvement, sooner  if worse.  Orders: UA Dipstick W/ Micro (manual) ([address]) T-Culture, Urine ([id number])  Complete Medication List: 1)  Voltaren 75 Mg Tbec (Diclofenac sodium) .... Take 1 tablet by mouth two times a day as needed 2)  Cipro 500 Mg Tabs (Ciprofloxacin hcl) .... One tab by mouth two times a day  Patient Instructions: 1)  Will call if not sensitive. Prescriptions: CIPRO 500 MG TABS (CIPROFLOXACIN HCL) one tab by mouth two times a day  #28 x 0   Entered and Authorized by:   Shaune Leeks MD   Signed by:   Shaune Leeks MD on 06/06/2009   Method used:   [facility]        [facility] [phone]* (retail)       [address] [place], [place]  [address]       Ph: [phone] or [phone]       Fax: [phone]   RxID:   [id number]   Current Allergies (reviewed today): ! * SULFA ! [facility]  Laboratory Results   Urine Tests  Date/Time Received: Jun 06, 2009 11:35 AM  Date/Time Reported: Jun 06, 2009 11:35 AM   Routine Urinalysis   Color: yellow Appearance: Cloudy Glucose: negative   (Normal Range: Negative) Bilirubin: negative   (  Normal Range: Negative) Ketone: negative   (Normal Range: Negative) Spec. Gravity: 1.020   (Normal Range: 1.003-1.035) Blood: large   (Normal Range: Negative) pH: 6.0   (Normal Range: 5.0-8.0) Protein: 100   (Normal Range: Negative) Urobilinogen: 0.2   (Normal Range: 0-1) Nitrite: positive   (Normal Range: Negative) Leukocyte Esterace: moderate   (Normal Range: Negative)  Urine Microscopic WBC/HPF: 8-12 RBC/HPF: 0-1 Bacteria/HPF: 4+ Epithelial/HPF: Rare        Appended Document: UT/RBH

## 2010-03-06 NOTE — Letter (Signed)
Summary: Nadara Eaton letter  [place] at [facility]  [address] [place], [place] [address]   Phone: [phone]  Fax: [phone]       09/06/2009 MRN: [id number]  [facility] [place] [address] [place], [place]  [address]  Dear Ms. [facility] [place], and [place] announce the retirement of Arta Silence, M.D., from full-time practice at the [facility] office effective August 02, 2009 and his plans of returning part-time.  It is important to Dr. Hetty Ely and to our practice that you understand that [facility] has seven physicians in our office for your health care needs.  We will continue to offer the same exceptional care that you have today.    Dr. Hetty Ely has spoken to many of you about his plans for retirement and returning part-time in the fall.   We will continue to work with you through the transition to schedule appointments for you in the office and meet the high standards that [facility] is committed to.   Again, it is with great pleasure that we share the news that Dr. Hetty Ely will return to [facility] at [facility] in October of 2011 with a reduced schedule.    If you have any questions, or would like to request an appointment with one of our physicians, please call us at [phone] and press the option for Scheduling an appointment.  We take pleasure in providing you with excellent patient care and look forward to seeing you at your next office visit.  Our [facility] Physicians are:  Tillman Abide, M.D. Laurita Quint, M.D. Roxy Manns, M.D. Kerby Nora, M.D. Hannah Beat, M.D. Ruthe Mannan, M.D. We proudly welcomed Raechel Ache, M.D. and Eustaquio Boyden, M.D. to the practice in July/August 2011.  Sincerely,  [facility]

## 2010-03-06 NOTE — Progress Notes (Signed)
Summary: Rash in mouth  Phone Note Call from Patient Call back at [phone]   Caller: Patient Call For: Shaune Leeks MD Summary of Call: pt states she has a rash on the inside of her mouth, and her throat stings. She doesn't have a fever or feel bad. 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 Initial call taken by: Mervin Hack CMA Duncan Dull),  February 06, 2009 10:48 AM  Follow-up for Phone Call        Could be a myriad of viral infections. Could be fungal infection of the mouth. Let me know if becomes red with covering typically white overlying the redness. Follow-up by: Shaune Leeks MD,  February 06, 2009 1:15 PM  Additional Follow-up for Phone Call Additional follow up Details #1::        Patient notified as instructed by telephone. Patient will continue to watch it and will call for appt if it does not improve or it get worse. Additional Follow-up by: Sydell Axon LPN,  February 06, 2009 2:18 PM

## 2010-03-06 NOTE — Assessment & Plan Note (Signed)
Summary: ? uti work in per [facility]   Vital Signs:  Patient Profile:   46 Years Old Female Height:     65 inches (165.1 cm) Weight:      215 pounds Temp:     98 degrees F oral Pulse rate:   68 / minute Pulse rhythm:   regular BP sitting:   120 / 80  (left arm) Cuff size:   large  Vitals Entered By: Providence Crosby (March 03, 2008 3:53 PM)                 Chief Complaint:  ? UTI .  History of Present Illness: Here for UTI--dysuria, odor--woke her this morning, no fever or chills, tired --thought it would go away---has had signs for >1wk.    Prior Medications Reviewed Using: Patient Recall  Current Allergies (reviewed today): ! * SULFA ! * MACRODANTIN     Review of Systems      See HPI   Physical Exam  General:     alert, well-developed, well-nourished, and well-hydrated.  no recent wt loss or gain Abdomen:     no CVA tenderness, does have suprapubic pressure aon palp--and frequency in the office Psych:     normally interactive and good eye contact.      Impression & Recommendations:  Problem # 1:  URINARY TRACT INFECTION, RECURRENT FREQUENTLY (ICD-599.0) Assessment: New U/A pos will start on Cipro two times a day x 7d needs to culture after 3=4d of completion of cipro--understands The following medications were removed from the medication list:    Zithromax Z-pak 250 Mg Tabs (Azithromycin) ..... Use as directed by mouth m  Her updated medication list for this problem includes:    Cipro 500 Mg Tabs (Ciprofloxacin hcl) .Marland Kitchen... 1 two times a day by mouth   Complete Medication List: 1)  Voltaren 75 Mg Tbec (Diclofenac sodium) .... Take 1 tablet by mouth two times a day as needed 2)  Cipro 500 Mg Tabs (Ciprofloxacin hcl) .Marland Kitchen.. 1 two times a day by mouth    Prescriptions: CIPRO 500 MG TABS (CIPROFLOXACIN HCL) 1 two times a day by mouth  #14 x 0   Entered and Authorized by:   Gildardo Griffes FNP   Signed by:   Gildardo Griffes FNP on  03/03/2008   Method used:   [facility]        [facility] [phone][facility] (retail)       [address] [place], [place]  [address]       Ph: [phone] or [phone]       Fax: [phone]   RxID:   [id number]   Laboratory Results   Urine Tests  Date/Time Recieved: March 03, 2008 3:54 PM Date/Time Reported: March 03, 2008 3:54 PM  Routine Urinalysis   Color: yellow Appearance: Cloudy Glucose: negative   (Normal Range: Negative) Bilirubin: negative   (Normal Range: Negative) Ketone: negative   (Normal Range: Negative) Spec. Gravity: <1.005   (Normal Range: 1.003-1.035) Blood: trace-intact   (Normal Range: Negative) pH: 7.0   (Normal Range: 5.0-8.0) Protein: 30   (Normal Range: Negative) Urobilinogen: 0.2   (Normal Range: 0-1) Nitrite: positive   (Normal Range: Negative) Leukocyte Esterace: large   (Normal Range: Negative)  Urine Microscopic WBC/hpf: 30-40 RBC/hpf: rare Bacteria: +++ Epithelial: rare    Comments: FOUL ODOR

## 2010-03-06 NOTE — Assessment & Plan Note (Signed)
Summary: SINUS INFECTION / LFW   Vital Signs:  Patient Profile:   45 Years Old Female Weight:      230 pounds Temp:     99.1 degrees F oral Pulse rate:   76 / minute Pulse rhythm:   regular BP sitting:   120 / 80  (left arm) Cuff size:   large  Vitals Entered By: Providence Crosby (March 26, 2007 4:01 PM)                 Chief Complaint:  FACE SORE// SORE THROAT .Marland Kitchen FATIQUED X 1 MONTH.  History of Present Illness: Here for eval, very tired for a month....started having sinus headaches a few weeks ago, no known fever recently, no ear pain, facial pain worst on left side, lots of pharyngeal drainage with ST, minimal rhinitis, No Sob, minimal cough, no N/V. Sudafed PE.    Prior Medications Reviewed Using: Patient Recall  Current Allergies (reviewed today): ! SULFA ! MACROBID      Physical Exam  General:     Well-developed,well-nourished,in no acute distress; alert,appropriate and cooperative throughout examination Head:     Normocephalic and atraumatic without obvious abnormalities. No apparent alopecia or balding. Sinuses, max>frontals exquisitely tender. Eyes:     Conjunctiva clear bilaterally.  Ears:     External ear exam shows no significant lesions or deformities.  Otoscopic examination reveals clear canals, tympanic membranes are intact bilaterally without bulging, retraction, inflammation or discharge. Hearing is grossly normal bilaterally. Nose:     mildly congested with tan mucous. Mouth:     Oral mucosa and oropharynx without lesions or exudates.  Teeth in good repair. Thick tan PND. Neck:     No deformities, masses, or tenderness noted. Chest Wall:     No deformities, masses, or tenderness noted. Lungs:     Normal respiratory effort, chest expands symmetrically. Lungs are clear to auscultation, no crackles or wheezes. Heart:     Normal rate and regular rhythm. S1 and S2 normal without gallop, murmur, click, rub or other extra sounds.    Impression &  Recommendations:  Problem # 1:  SINUSITIS- ACUTE-NOS (ICD-461.9) Assessment: New See instructions. The following medications were removed from the medication list:    Cipro 500 Mg Tabs (Ciprofloxacin hcl) .Marland Kitchen... 1 bid  Her updated medication list for this problem includes:    Ceftin 250 Mg Tabs (Cefuroxime axetil) ..... One tab by mouth two times a day Instructed on treatment. Call if symptoms persist or worsen.   Complete Medication List: 1)  Ceftin 250 Mg Tabs (Cefuroxime axetil) .... One tab by mouth two times a day   Patient Instructions: 1)  Take Ceftin. 2)  Guaifenesen, pills or liquid, 600mg  by mouth AM and NOON for the next 4-5 days. 3)  Tylenol ES two tabs by mouth four times a day as needed 4)                                 or 5)  Aleeve two tabs by mouth with breakfast and supper. 6)  Push fluids.  7)  GUAIFENESIN  600mg  by mouth AM and NOON   8)    ROBITUSSIN PLAIN (NO LETTERS, NO NAMES) two tablespoons  or 9)    [facility] MUCOUS RELIEF EXPECTORANT (400 mg) 11/2 TABS  or 10)    GUAIFENESIN (200 MG) 3 TABS  Prescriptions: CEFTIN 250 MG  TABS (CEFUROXIME AXETIL) one tab by mouth two times a day  #28 x 0   Entered and Authorized by:   Shaune Leeks MD   Signed by:   Shaune Leeks MD on 03/26/2007   Method used:   Print then Give to Patient   RxID:   [id number]  ]

## 2010-03-06 NOTE — Assessment & Plan Note (Signed)
Summary: FEVER,H/A SEVERE ST,EXPOSED TO STREP,COUGH/RI   Vital Signs:  Patient profile:   45 year old female Weight:      207.13 pounds BMI:     34.59 Temp:     100.7 degrees F oral Pulse rate:   96 / minute Pulse rhythm:   regular BP sitting:   128 / 76  (left arm) Cuff size:   large  Vitals Entered By: Linde Gillis CMA Duncan Dull) (March 09, 2009 2:27 PM) CC: fever, headache, sore throat   Acute Visit History:      The patient complains of cough, fever, headache, nausea, sinus problems, and sore throat.  These symptoms began 3 days ago.  She denies earache.  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        Her highest temperature has been 101.5.  This temperature was recorded last evening.        The character of the cough is described as productive, dark green today.  There is no history of wheezing or shortness of breath associated with her cough.        She complains of sinus pressure, ears being blocked, and nasal congestion.        Problems Prior to Update: 1)  Shoulder Pain, Right  (ICD-719.41) 2)  Obesity  (ICD-278.00) 3)  Uri  (ICD-465.9) 4)  Heel Pain  (ICD-729.5) 5)  Urinary Tract Infection, Recurrent Frequently  (ICD-599.0) 6)  Sinusitis- Acute-nos  (ICD-461.9)  Current Medications (verified): 1)  Voltaren 75 Mg Tbec (Diclofenac Sodium) .... Take 1 Tablet By Mouth Two Times A Day As Needed 2)  Tamiflu 75 Mg Caps (Oseltamivir Phosphate) .Marland Kitchen.. 1 Tab By Mouth Two Times A Day X 5 3)  Cheratussin Ac 100-10 Mg/63ml Syrp (Guaifenesin-Codeine) .Marland Kitchen.. 1-2 Tsp By Mouth At Bedtime As Needed Cough  Allergies: 1)  ! * Sulfa 2)  ! * Macrodantin  Past History:  Past medical, surgical, family and social histories (including risk factors) reviewed, and no changes noted (except as noted below).  Past Surgical History: Reviewed history from 03/30/2007 and no changes required. TONSILLECTOMY (18 YOA) NSVD x 2 1995   1998 HOSP GUILLIAN BARRE PLASMAPHORESIS 1 WEEK ([facility]) 06/1998  Family History: Reviewed history from 03/30/2007 and no changes required. Father:DOES NOT KNOW WAS ADOPTED  Mother: DOES NOT KNOW WAS ADOPTED Siblings: CV: ?  HBP: ? DM: +MGM GOUT/ARTHRITIS: PROSTATE CANCER: BREAST/OVARIAN/UTERINE CANCER: ? COLON CANCER: ? DEPRESSION: ETOH/DRUG ABUSE: OTHER: ? STROKES  Social History: Reviewed history from 03/30/2007 and no changes required. Marital Status: MarriedLIVES WITH HUSBAND Children: 2 Occupation: HOUSEWIFE// TEACHING AT [employer]  Review of Systems General:  Complains of fatigue. CV:  Complains of chest pain or discomfort. Resp:  Denies shortness of breath. GI:  Denies abdominal pain.  Physical Exam  General:  fatigued appearing female in NAD Head:  maxillary sinus ttp  B, as well as frontal sinus ttp..whole head hurts Ears:  External ear exam shows no significant lesions or deformities.  Otoscopic examination reveals clear canals, tympanic membranes are intact bilaterally without bulging, retraction, inflammation or discharge. Hearing is grossly normal bilaterally. Nose:  nasal discharge, no mucosal pallor.   Mouth:  MMM Neck:  no carotid bruit or thyromegaly   Lungs:  Normal respiratory effort, chest expands symmetrically. Lungs are clear to auscultation, no crackles or wheezes. Heart:  Normal rate and regular rhythm. S1 and S2 normal without gallop, murmur, click, rub or other  extra sounds. Cervical Nodes:  R anterior LN tender and L anterior LN tender.     Impression & Recommendations:  Problem # 1:  INFLUENZA LIKE ILLNESS (ICD-487.1) Discussed symptomatic care.  Hydration, rest. Call if SOB, cough worsening or prolongued fever. Reviewed flu timeline. She has no health problems that put her at risk for complications , but given the severity of her symptoms, she had requested  tamiflu. Discussed family prophylaxis She was advised to not return  to work until 24 hour after fever resolved on no antipyretics.    Complete Medication List: 1)  Voltaren 75 Mg Tbec (Diclofenac sodium) .... Take 1 tablet by mouth two times a day as needed 2)  Tamiflu 75 Mg Caps (Oseltamivir phosphate) .Marland Kitchen.. 1 tab by mouth two times a day x 5 3)  Cheratussin Ac 100-10 Mg/50ml Syrp (Guaifenesin-codeine) .Marland Kitchen.. 1-2 tsp by mouth at bedtime as needed cough  Patient Instructions: 1)  Hydration, rest. Call if SOB, cough worsening or prolongued fever. Reviewed flu timeline. Advised to not return to work until 24 hour after fever resolved on no antipyretics.   Prescriptions: CHERATUSSIN AC 100-10 MG/5ML SYRP (GUAIFENESIN-CODEINE) 1-2 tsp by mouth at bedtime as needed cough  #8 oz x 0   Entered and Authorized by:   Kerby Nora MD   Signed by:   Kerby Nora MD on 03/09/2009   Method used:   Print then Give to Patient   RxID:   [id number] TAMIFLU 75 MG CAPS (OSELTAMIVIR PHOSPHATE) 1 tab by mouth two times a day x 5  #10 x 0   Entered and Authorized by:   Kerby Nora MD   Signed by:   Kerby Nora MD on 03/09/2009   Method used:   [facility]        [facility] [phone][facility] (retail)       [address] [place], [place]  [address]       Ph: [phone] or [phone]       Fax: [phone]   RxID:   [id number]   Current Allergies (reviewed today): ! * SULFA ! [facility]  Laboratory Results    Other Tests  Rapid Strep: negative  Kit Test Internal QC: Positive   (Normal Range: Negative)

## 2010-03-07 NOTE — Progress Notes (Signed)
Summary: Rx Diclofenac  Phone Note Refill Request Call back at [phone] Message from:  [facility] on January 16, 2010 8:36 AM  Refills Requested: Medication #1:  VOLTAREN 75 MG TBEC Take 1 tablet by mouth two times a day as needed sparingly   Last Refilled: 07/05/2009  Method Requested: Electronic Initial call taken by: Sydell Axon LPN,  January 16, 2010 8:36 AM    Prescriptions: VOLTAREN 75 MG TBEC (DICLOFENAC SODIUM) Take 1 tablet by mouth two times a day as needed sparingly  #60 x 0   Entered and Authorized by:   Shaune Leeks MD   Signed by:   Shaune Leeks MD on 01/16/2010   Method used:   [facility] to        [facility] [phone]* (retail)       [address] [place], [place]  [address]       Ph: [phone] or [phone]       Fax: [phone]   RxID:   [id number]

## 2010-04-01 ENCOUNTER — Encounter: Payer: Self-pay | Admitting: Family Medicine

## 2010-04-02 ENCOUNTER — Encounter: Payer: Self-pay | Admitting: Family Medicine

## 2010-04-02 LAB — CONVERTED CEMR LAB
Cholesterol: 154 mg/dL
HDL: 56 mg/dL
LDL Cholesterol: 86 mg/dL
T4, Total: 7.4 ug/dL
TSH: 4.54 microintl units/mL
Triglycerides: 58 mg/dL

## 2010-04-10 ENCOUNTER — Ambulatory Visit (INDEPENDENT_AMBULATORY_CARE_PROVIDER_SITE_OTHER): Payer: BC Managed Care – PPO | Admitting: Family Medicine

## 2010-04-10 ENCOUNTER — Encounter: Payer: Self-pay | Admitting: Family Medicine

## 2010-04-10 DIAGNOSIS — E039 Hypothyroidism, unspecified: Secondary | ICD-10-CM | POA: Insufficient documentation

## 2010-04-10 DIAGNOSIS — R946 Abnormal results of thyroid function studies: Secondary | ICD-10-CM

## 2010-04-11 ENCOUNTER — Encounter: Payer: Self-pay | Admitting: Family Medicine

## 2010-04-16 NOTE — Miscellaneous (Signed)
Summary: [facility] lab results  Clinical Lists Changes  Observations: Added new observation of TSH: 4.540 microintl units/mL (04/02/2010 16:59) Added new observation of T4, TOTAL: 7.4 mcg/dL (65/78/4696 29:52) Added new observation of LDL: 86 mg/dL (84/13/2440 10:27) Added new observation of HDL: 56 mg/dL (25/36/6440 34:74) Added new observation of TRIGLYC TOT: 58 mg/dL (25/95/6387 56:43) Added new observation of CHOLESTEROL: 154 mg/dL (32/95/1884 16:60)

## 2010-04-16 NOTE — Assessment & Plan Note (Signed)
Summary: THYROID ELEVATED / LFW   Vital Signs:  Patient profile:   45 year old female Weight:      223.75 pounds Temp:     98.2 degrees F oral Pulse rate:   68 / minute Pulse rhythm:   regular BP sitting:   126 / 82  (left arm) Cuff size:   large  Vitals Entered By: Sydell Axon LPN (April 09, 2128 3:41 PM) CC: Thyroid is elevated per GYN   History of Present Illness: Pt here after having her annual PE with Gyn and being told to see me for abnml lab test. Her TSH was 4.540 with high being 4.500. She denies skin changes, hair brittleness, fatigue or tiredness, weight gain. She feels reasonably well with no other complaints.  Problems Prior to Update: 1)  Uti  (ICD-599.0) 2)  Influenza Like Illness  (ICD-487.1) 3)  Shoulder Pain, Right  (ICD-719.41) 4)  Obesity  (ICD-278.00) 5)  Uri  (ICD-465.9) 6)  Heel Pain  (ICD-729.5) 7)  Urinary Tract Infection, Recurrent Frequently  (ICD-599.0) 8)  Sinusitis- Acute-nos  (ICD-461.9)  Medications Prior to Update: 1)  Voltaren 75 Mg Tbec (Diclofenac Sodium) .... Take 1 Tablet By Mouth Two Times A Day As Needed Sparingly 2)  Cipro 250 Mg Tabs (Ciprofloxacin Hcl) .Marland Kitchen.. 1 By Mouth Two Times A Day For 7 Days  Current Medications (verified): 1)  Voltaren 75 Mg Tbec (Diclofenac Sodium) .... Take 1 Tablet By Mouth Two Times A Day As Needed Sparingly  Allergies: 1)  ! * Sulfa 2)  ! * Macrodantin  Physical Exam  General:  overweight but generally well appearing  Head:  normocephalic, atraumatic, and no abnormalities observed.   Eyes:  Conjunctiva clear bilaterally.  Ears:  External ear exam shows no significant lesions or deformities.  Otoscopic examination reveals clear canals, tympanic membranes are intact bilaterally without bulging, retraction, inflammation or discharge. Hearing is grossly normal bilaterally. Nose:  nasal discharge, no mucosal pallor.   Mouth:  pharynx pink and moist.   Neck:  No deformities, masses, or tenderness  noted. Lungs:  Normal respiratory effort, chest expands symmetrically. Lungs are clear to auscultation, no crackles or wheezes. Heart:  Normal rate and regular rhythm. S1 and S2 normal without gallop, murmur, click, rub or other extra sounds.   Impression & Recommendations:  Problem # 1:  HYPOTHYROIDISM, BORDERLINE (ICD-244.9) Assessment New  Discussed pathophysiology of thyroid metabolism. Will not start meds at this point.  Knows to return for onset of sxs or one year.  Labs Reviewed: TSH: 3.12 (11/20/2003)   TSH 4.54 (04/02/10)  Complete Medication List: 1)  Voltaren 75 Mg Tbec (Diclofenac sodium) .... Take 1 tablet by mouth two times a day as needed sparingly  Patient Instructions: 1)  RTC as needed.   Orders Added: 1)  Est. Patient Level III [86578]    Current Allergies (reviewed today): ! * SULFA ! * MACRODANTIN

## 2010-07-13 ENCOUNTER — Other Ambulatory Visit: Payer: Self-pay | Admitting: Family Medicine

## 2010-07-13 NOTE — Telephone Encounter (Signed)
Request came in electronically from pharmacy. Is it okay to refill this?

## 2010-07-18 ENCOUNTER — Other Ambulatory Visit: Payer: Self-pay | Admitting: Family Medicine

## 2010-07-23 ENCOUNTER — Other Ambulatory Visit: Payer: Self-pay | Admitting: Family Medicine

## 2010-09-20 ENCOUNTER — Encounter: Payer: Self-pay | Admitting: Family Medicine

## 2010-09-20 ENCOUNTER — Ambulatory Visit (INDEPENDENT_AMBULATORY_CARE_PROVIDER_SITE_OTHER): Payer: BC Managed Care – PPO | Admitting: Family Medicine

## 2010-09-20 DIAGNOSIS — H6980 Other specified disorders of Eustachian tube, unspecified ear: Secondary | ICD-10-CM

## 2010-09-20 MED ORDER — FLUTICASONE PROPIONATE 50 MCG/ACT NA SUSP: 2.0000 | Freq: Every day | NASAL | Status: DC

## 2010-09-20 NOTE — Assessment & Plan Note (Signed)
Exam negative for active infection currently. On exam, more congestion behind R TM, but sxs only on left. Anticipate ETD along with PND, some sinus congestion exacerbated after mowing lawn. Recommend nasal saline irrigation, start flonase. Update [place] if not improving as expected.

## 2010-09-20 NOTE — Patient Instructions (Signed)
Some postnasal drip today. I think your eustachian tubes aren't opening up as well as they could. Start nasal saline irrigation a few times a day.  Start flonase (nasal steroid) 2 sprays in each nostril daily. If not improving or any worsening (fever, chills, pain), let us know.  We may want to see you again. Hope your ear starts feeling better quickly.

## 2010-09-20 NOTE — Progress Notes (Signed)
  Subjective:    Patient ID: Sherry Mendoza, female    DOB: 04/12/1965, 45 y.o.   MRN: [id number]  HPI CC: L ear issues  L ear popping for 5 days now.  "feels like opening and closing".  No pain.  No soreness around face.  Mild muffled hearing.  Mild drainage which is normal for her (postnasal).  Did mow lawn Monday morning prior to starting to have sxs.  Tried allegra for a few days, didn't notice difference.    No draining from ear, no fevers/chills, abd pain, nausea/vomiting, rashes.  No congestion, RN, sneezing.  No coughing.  No HA.  Review of Systems Per HPI    Objective:   Physical Exam  Nursing note and vitals reviewed. Constitutional: She appears well-developed and well-nourished. No distress.  HENT:  Head: Normocephalic and atraumatic.  Right Ear: Hearing, tympanic membrane, external ear and ear canal normal.  Left Ear: Hearing, tympanic membrane, external ear and ear canal normal.  Nose: No mucosal edema or rhinorrhea. Right sinus exhibits no maxillary sinus tenderness and no frontal sinus tenderness. Left sinus exhibits no maxillary sinus tenderness and no frontal sinus tenderness.  Mouth/Throat: Uvula is midline, oropharynx is clear and moist and mucous membranes are normal. No oropharyngeal exudate or posterior oropharyngeal edema.       R TM - congestion behind TM, fluid > L TM  Eyes: Conjunctivae and EOM are normal. Pupils are equal, round, and reactive to light. No scleral icterus.  Neck: Normal range of motion. Neck supple.  Lymphadenopathy:    She has no cervical adenopathy.  Skin: Skin is warm and dry. No rash noted.  Psychiatric: She has a normal mood and affect.          Assessment & Plan:

## 2010-12-03 ENCOUNTER — Other Ambulatory Visit: Payer: Self-pay | Admitting: Family Medicine

## 2011-02-27 ENCOUNTER — Ambulatory Visit (INDEPENDENT_AMBULATORY_CARE_PROVIDER_SITE_OTHER): Payer: BC Managed Care – PPO | Admitting: Family Medicine

## 2011-02-27 VITALS — BP 120/86 | Temp 98.6°F | Wt 230.0 lb

## 2011-02-27 DIAGNOSIS — N390 Urinary tract infection, site not specified: Secondary | ICD-10-CM

## 2011-02-27 LAB — POCT URINALYSIS DIPSTICK
Bilirubin, UA: NEGATIVE
Glucose, UA: NEGATIVE
Ketones, UA: NEGATIVE
Nitrite, UA: POSITIVE
Protein, UA: 100
Spec Grav, UA: 1.01
Urobilinogen, UA: 0.2
pH, UA: 6.5

## 2011-02-27 MED ORDER — CIPROFLOXACIN HCL 500 MG PO TABS: 500.0000 mg | ORAL_TABLET | Freq: Two times a day (BID) | ORAL | Status: AC

## 2011-02-27 NOTE — Patient Instructions (Signed)

## 2011-02-27 NOTE — Progress Notes (Signed)
SUBJECTIVE: Sherry Mendoza is a 46 y.o. female who complains of urinary frequency, urgency and dysuria x 3 days. This morning she has had some right sided flank pain and nausea.  Nofever, chills, or abnormal vaginal discharge or bleeding.   Does have h/o recurrent UTIs.  Allergic to sulfa and macrobid.  Patient Active Problem List  Diagnoses  . OBESITY  . SHOULDER PAIN, RIGHT  . HYPOTHYROIDISM, BORDERLINE  . ETD (eustachian tube dysfunction)  . UTI (urinary tract infection)   Past Medical History  Diagnosis Date  . Frequent UTI     in past-with uro w/u and urethral dilation  . Guillain-Barre 06/1998    Guillain-Barre Plasmaphoresis- hospitalized x 1 week at [facility]   Past Surgical History  Procedure Date  . Tonsillectomy 1985   History  Substance Use Topics  . Smoking status: Never Smoker   . Smokeless tobacco: Not on file  . Alcohol Use: No   No family history on file. Allergies  Allergen Reactions  . Nitrofurantoin     REACTION: ELEVATED LIVER FUNCTIONS  . Sulfonamide Derivatives     REACTION: ITCHING   Current Outpatient Prescriptions on File Prior to Visit  Medication Sig Dispense Refill  . diclofenac (VOLTAREN) 75 MG EC tablet TAKE 1 TABLET BY MOUTH TWO TIMES A DAY AS NEEDED SPARINGLY  60 tablet  0  . fluticasone (FLONASE) 50 MCG/ACT nasal spray Place 2 sprays into the nose daily.  16 g  1   The PMH, PSH, Social History, Family History, Medications, and allergies have been reviewed in [facility], and have been updated if relevant.  OBJECTIVE:  BP 120/86  Temp(Src) 98.6 F (37 C) (Tympanic)  Wt 230 lb (104.327 kg) Appears well, in no apparent distress.  Vital signs are normal. The abdomen is soft without tenderness, guarding, mass, rebound or organomegaly. No CVA tenderness or inguinal adenopathy noted. Urine dipstick shows positive for RBC's, positive for nitrates and positive for leukocytes.   ASSESSMENT: UTI \\complicated  with ?evidence of pyelonephritis  PLAN:  Treatment per orders - cipro 500 mg twice daily x 7 days, send for cx due to multiple abx allergies, also push fluids, may use Pyridium OTC prn. Call or return to clinic prn if these symptoms worsen or fail to improve as anticipated.

## 2011-05-06 ENCOUNTER — Other Ambulatory Visit: Payer: Self-pay | Admitting: Family Medicine

## 2011-06-27 ENCOUNTER — Encounter: Payer: Self-pay | Admitting: Family Medicine

## 2011-06-27 ENCOUNTER — Ambulatory Visit (INDEPENDENT_AMBULATORY_CARE_PROVIDER_SITE_OTHER): Payer: BC Managed Care – PPO | Admitting: Family Medicine

## 2011-06-27 VITALS — BP 108/74 | HR 64 | Temp 98.3°F | Wt 208.0 lb

## 2011-06-27 DIAGNOSIS — R300 Dysuria: Secondary | ICD-10-CM

## 2011-06-27 DIAGNOSIS — M25519 Pain in unspecified shoulder: Secondary | ICD-10-CM

## 2011-06-27 DIAGNOSIS — N390 Urinary tract infection, site not specified: Secondary | ICD-10-CM

## 2011-06-27 LAB — POCT URINALYSIS DIPSTICK
Bilirubin, UA: NEGATIVE
Blood, UA: NEGATIVE
Glucose, UA: NEGATIVE
Ketones, UA: NEGATIVE
Nitrite, UA: NEGATIVE
Protein, UA: NEGATIVE
Spec Grav, UA: <=1.005
Urobilinogen, UA: NEGATIVE
pH, UA: 6.5

## 2011-06-27 MED ORDER — CIPROFLOXACIN HCL 250 MG PO TABS: 250.0000 mg | ORAL_TABLET | Freq: Two times a day (BID) | ORAL | Status: AC

## 2011-06-27 NOTE — Progress Notes (Signed)
Dysuria: yes, burning and frequency duration of symptoms: 1 week abdominal pain: mild  fevers:no back pain:no vomiting:no With with +LE on u/a   Shoulder pain, right. Joined the gym and was exercising.  Pain with R shoulder ext rotation, lifting above her head.  Going on for about 3 weeks.  Taking aleve w/o much relief.  Ibuprofen helps some.    Meds, vitals, and allergies reviewed.   ROS: See HPI.  Otherwise negative.    GEN: nad, alert and oriented HEENT: mucous membranes moist NECK: supple CV: rrr.  PULM: ctab, no inc wob ABD: soft, +bs, suprapubic area tender EXT: no edema SKIN: no acute rash BACK: no CVA pain R shoulder with normal inspection, AC not ttp but pain with ext rotation, supraspinatus testing, + impingement.  Less pain with int rotation.  +scap assist, no arm drop

## 2011-06-27 NOTE — Patient Instructions (Signed)
Drink plenty of water and start the antibiotics today.  We'll contact you with your lab report.  Take care.   Take ibuprofen 200mg , up to 3 tabs 3 times a day with food.  Use the home exercises.  Notify me if not better.

## 2011-06-30 LAB — URINE CULTURE: Colony Count: >=100000

## 2011-06-30 NOTE — Assessment & Plan Note (Signed)
Cuff strain likely. D/w pt ZO:XWRUEAV and path/phys.  D/w pt about home tx with handout given for exercises.  She understood. PT declined.  Call back if not improved.  D/w pt about maintaining rom to limit possible frozen sx.

## 2011-06-30 NOTE — Assessment & Plan Note (Signed)
Cipro, ucx, fluids, see notes on labs.

## 2011-08-06 ENCOUNTER — Other Ambulatory Visit: Payer: Self-pay | Admitting: Family Medicine

## 2011-10-02 ENCOUNTER — Ambulatory Visit (INDEPENDENT_AMBULATORY_CARE_PROVIDER_SITE_OTHER): Payer: BC Managed Care – PPO | Admitting: Family Medicine

## 2011-10-02 ENCOUNTER — Encounter: Payer: Self-pay | Admitting: Family Medicine

## 2011-10-02 VITALS — BP 122/88 | HR 64 | Temp 98.2°F | Wt 203.0 lb

## 2011-10-02 DIAGNOSIS — R300 Dysuria: Secondary | ICD-10-CM

## 2011-10-02 DIAGNOSIS — N390 Urinary tract infection, site not specified: Secondary | ICD-10-CM

## 2011-10-02 LAB — POCT URINALYSIS DIPSTICK
Bilirubin, UA: NEGATIVE
Glucose, UA: NEGATIVE
Ketones, UA: NEGATIVE
Nitrite, UA: NEGATIVE
Protein, UA: NEGATIVE
Spec Grav, UA: <=1.005
Urobilinogen, UA: 0.2
pH, UA: 6.5

## 2011-10-02 MED ORDER — CIPROFLOXACIN HCL 500 MG PO TABS: 500.0000 mg | ORAL_TABLET | Freq: Two times a day (BID) | ORAL | Status: AC

## 2011-10-02 NOTE — Patient Instructions (Signed)
You have a urinary infection. Treat with 5 day course of cipro.   We will send off culture Return if symptoms worsen or fail to improve.

## 2011-10-02 NOTE — Assessment & Plan Note (Signed)
Rpt UTI - treat with 3d course of cipro.  UCx sent given h/o recurrent UTIs. Update if sxs not improving. See pt instructions.

## 2011-10-02 NOTE — Progress Notes (Signed)
  Subjective:    Patient ID: Sherry Mendoza, female    DOB: [birth year], 46 y.o.   MRN: [id number]  HPI CC: UTI?  Pleasant 46yo with h/o GBS and recurrent UTIs presents with concern for rpt UTI.  3d h/o dysuria, worse this morning, persistent.  Also with lower abd and back pain.  Frequency.  Mild nausea.  Bad headache today.  No hematuria, fevers/chills.   Last UTI was 06/2011.  Past Medical History  Diagnosis Date  . Frequent UTI     in past-with uro w/u and urethral dilation  . Guillain-Barre 06/1998    Guillain-Barre Plasmaphoresis- hospitalized x 1 week at [facility]     Review of Systems Per HPI    Objective:   Physical Exam  Nursing note and vitals reviewed. Constitutional: She appears well-developed and well-nourished. No distress.  Abdominal: Soft. Bowel sounds are normal. She exhibits no distension and no mass. There is no hepatosplenomegaly. There is no tenderness. There is no rebound, no guarding and no CVA tenderness.  Skin: Skin is warm and dry. No rash noted.  Psychiatric: She has a normal mood and affect.       Assessment & Plan:

## 2011-10-03 ENCOUNTER — Ambulatory Visit: Payer: BC Managed Care – PPO | Admitting: Family Medicine

## 2011-10-11 LAB — URINE CULTURE: Colony Count: >=100000

## 2011-11-23 ENCOUNTER — Other Ambulatory Visit: Payer: Self-pay | Admitting: Family Medicine

## 2012-02-12 ENCOUNTER — Other Ambulatory Visit: Payer: Self-pay | Admitting: Family Medicine

## 2012-02-13 NOTE — Telephone Encounter (Signed)
Sent!

## 2012-02-13 NOTE — Telephone Encounter (Signed)
toPcp

## 2012-03-25 ENCOUNTER — Telehealth: Payer: Self-pay

## 2012-03-25 ENCOUNTER — Encounter: Payer: Self-pay | Admitting: Family Medicine

## 2012-03-25 ENCOUNTER — Ambulatory Visit (INDEPENDENT_AMBULATORY_CARE_PROVIDER_SITE_OTHER): Payer: BC Managed Care – PPO | Admitting: Family Medicine

## 2012-03-25 VITALS — BP 130/84 | HR 61 | Temp 98.2°F | Ht 65.0 in | Wt 206.5 lb

## 2012-03-25 DIAGNOSIS — J0120 Acute ethmoidal sinusitis, unspecified: Secondary | ICD-10-CM

## 2012-03-25 DIAGNOSIS — J0121 Acute recurrent ethmoidal sinusitis: Secondary | ICD-10-CM

## 2012-03-25 MED ORDER — AMOXICILLIN 500 MG PO CAPS: 1000.0000 mg | ORAL_CAPSULE | Freq: Two times a day (BID) | ORAL | Status: DC

## 2012-03-25 NOTE — Telephone Encounter (Signed)
For one week pt had cold symptoms; today h/a on rt side of head and rt eye watering;productive cough with yellow mucus, No fever or SOB. Pt has been taking mucinex and request antibiotic. Dr Para March does not have available appt and will not be in office on 03/26/12 and request pt scheduled with another physician. Pt scheduled appt with Dr Patsy Lager today at 3:45 pm.

## 2012-03-25 NOTE — Progress Notes (Signed)
Nature conservation officer at [facility] [address] [place] [address] Phone: [phone] Fax: [phone]  Date:  03/25/2012   Name:  Sherry Sherry   DOB:  Jun 08, 1965   MRN:  [id number] Gender: female Age: 47 y.o.  Primary Physician:  Crawford Givens, MD  Evaluating MD: Hannah Beat, MD   Chief Complaint: Headache, Cough and watery eyes   History of Present Illness:  Sherry Sherry is a 47 y.o. pleasant patient who presents with the following:  Head hurting a lot, mucinex. Plain mucinex.  The patient has been sick for about one week, she has had worsening headache to the point where she cannot really sleep over the last few days. She has been taking some Mucinex, which has not really helped significantly. At this point the headache and the headache behind her eyes and pain behind her eyes is the worst thing that is bothering her. She is not having any significant earache, but does have some fullness. No sore throat. No productive cough, but she is having a mild cough. No nausea, vomiting, or diarrhea.   Patient Active Problem List  Diagnosis  . OBESITY  . SHOULDER PAIN, RIGHT  . HYPOTHYROIDISM, BORDERLINE  . ETD (eustachian tube dysfunction)  . UTI (urinary tract infection)    Past Medical History  Diagnosis Date  . Frequent UTI     in past-with uro w/u and urethral dilation  . Guillain-Barre 06/1998    Guillain-Barre Plasmaphoresis- hospitalized x 1 week at [facility]    Past Surgical History  Procedure Laterality Date  . Tonsillectomy  1985    History  Substance Use Topics  . Smoking status: Never Smoker   . Smokeless tobacco: Not on file  . Alcohol Use: No    No family history on file.  Allergies  Allergen Reactions  . Influenza Vaccines     H/o gillian barre syndrome  . Nitrofurantoin     REACTION: ELEVATED LIVER FUNCTIONS  . Sulfonamide Derivatives     REACTION: ITCHING    Medication list has been reviewed and updated.  Outpatient Prescriptions  Prior to Visit  Medication Sig Dispense Refill  . diclofenac (VOLTAREN) 75 MG EC tablet TAKE 1 TABLET BY MOUTH TWO TIMES A DAY AS NEEDED SPARINGLY  60 tablet  0  . diclofenac (VOLTAREN) 75 MG EC tablet TAKE 1 TABLET BY MOUTH TWO TIMES A DAY AS NEEDED SPARINGLY  60 tablet  1  . fluticasone (FLONASE) 50 MCG/ACT nasal spray Place 2 sprays into the nose daily.  16 g  1   No facility-administered medications prior to visit.    Review of Systems:  ROS: GEN: Acute illness details above GI: Tolerating PO intake GU: maintaining adequate hydration and urination Pulm: No SOB Interactive and getting along well at home.  Otherwise, ROS is as per the HPI.   Physical Examination: BP 130/84  Pulse 61  Temp(Src) 98.2 F (36.8 C) (Oral)  Ht 5\' 5"  (1.651 m)  Wt 206 lb 8 oz (93.668 kg)  BMI 34.36 kg/m2  SpO2 98%  Ideal Body Weight: Weight in (lb) to have BMI = 25: 149.9   Gen: WDWN, NAD; alert,appropriate and cooperative throughout exam  HEENT: Normocephalic and atraumatic. Throat clear, w/o exudate, no LAD, R TM clear, L TM - good landmarks, No fluid present. rhinnorhea.  Left frontal and maxillary sinuses: Tender max Right frontal and maxillary sinuses: Tender mostly at ethmoid, mildly in max  Neck: No ant or post LAD CV: RRR, No  M/G/R Pulm: Breathing comfortably in no resp distress. no w/c/r Abd: S,NT,ND,+BS Extr: no c/c/e Psych: full affect, pleasant   Assessment and Plan:  Acute recurrent ethmoidal sinusitis  Ethmoid sinusitis by history and examination. Treat with high-dose amoxicillin, and supportive care.  Orders Today:  No orders of the defined types were placed in this encounter.    Updated Medication List: (Includes new medications, updates to list, dose adjustments) Meds ordered this encounter  Medications  . amoxicillin (AMOXIL) 500 MG capsule    Sig: Take 2 capsules (1,000 mg total) by mouth 2 (two) times daily.    Dispense:  40 capsule    Refill:  0     Medications Discontinued: Medications Discontinued During This Encounter  Medication Reason  . diclofenac (VOLTAREN) 75 MG EC tablet Error  . fluticasone (FLONASE) 50 MCG/ACT nasal spray Error  . diclofenac (VOLTAREN) 75 MG EC tablet Error      Signed, Estelle Greenleaf T. Demondre Aguas, MD 03/25/2012 4:25 PM

## 2012-04-26 ENCOUNTER — Ambulatory Visit (INDEPENDENT_AMBULATORY_CARE_PROVIDER_SITE_OTHER): Payer: BC Managed Care – PPO | Admitting: Family Medicine

## 2012-04-26 ENCOUNTER — Encounter: Payer: Self-pay | Admitting: Family Medicine

## 2012-04-26 VITALS — BP 122/80 | HR 64 | Temp 98.5°F | Wt 193.0 lb

## 2012-04-26 DIAGNOSIS — E669 Obesity, unspecified: Secondary | ICD-10-CM

## 2012-04-26 DIAGNOSIS — M545 Low back pain, unspecified: Secondary | ICD-10-CM | POA: Insufficient documentation

## 2012-04-26 NOTE — Assessment & Plan Note (Signed)
Needs to total her calories and cut by ~5%.  D/w pt. Continue exercise.

## 2012-04-26 NOTE — Assessment & Plan Note (Signed)
SLR neg and likely overall improved with recent diclofenac.  Given handout on LBP stretches and she can ease back into exercise.  Anatomy d/w pt.

## 2012-04-26 NOTE — Patient Instructions (Addendum)
Take the diclofenac twice a day with food and use the exercises on the handout.   This should gradually improve.  You may want to try the elliptical instead of the treadmill.

## 2012-04-26 NOTE — Progress Notes (Signed)
Back pain. Started about 3 weeks ago.  Sore in the lower back.  Recently with some pain down the R hip and leg, posteriorly.  Some better today with diclofenac over the weekend along with stretching and ice. No L sided sx.  No FCNAV.  No injury.  Had been exercising until recently with a trainer.  No weakness.   Trying to lose weight.  Has leveled off w/o progressive loss.   Meds, vitals, and allergies reviewed.   ROS: See HPI.  Otherwise, noncontributory.  nad Back w/o midline pain No rash Mildly ttp in R lower L spine lateral paraspinal muscles SLR neg SI testing wnl DTRs wnl BLE Gait wnl

## 2012-05-21 ENCOUNTER — Other Ambulatory Visit: Payer: Self-pay | Admitting: Family Medicine

## 2012-06-10 ENCOUNTER — Encounter: Payer: Self-pay | Admitting: Family Medicine

## 2012-06-10 ENCOUNTER — Ambulatory Visit (INDEPENDENT_AMBULATORY_CARE_PROVIDER_SITE_OTHER)
Admission: RE | Admit: 2012-06-10 | Discharge: 2012-06-10 | Disposition: A | Discharge status: Home / Self Care | Payer: BC Managed Care – PPO | Source: Ambulatory Visit | Attending: Family Medicine | Admitting: Family Medicine

## 2012-06-10 ENCOUNTER — Ambulatory Visit (INDEPENDENT_AMBULATORY_CARE_PROVIDER_SITE_OTHER): Payer: BC Managed Care – PPO | Admitting: Family Medicine

## 2012-06-10 VITALS — BP 120/80 | HR 67 | Temp 98.2°F | Wt 206.5 lb

## 2012-06-10 DIAGNOSIS — M545 Low back pain, unspecified: Secondary | ICD-10-CM

## 2012-06-10 DIAGNOSIS — M549 Dorsalgia, unspecified: Secondary | ICD-10-CM

## 2012-06-10 NOTE — Assessment & Plan Note (Signed)
She has mild degenerative changes and scoliosis. No acute finding by plain radiography. I would expect this to gradually improve, can consider formal PT if she desires. Antiinflammatories and core strengthening are reasonable in the meantime. If she continues to have pain, we can have her see ortho, can refer if needed.

## 2012-06-10 NOTE — Patient Instructions (Addendum)
Go to the lab on the way out.  We'll contact you with your xray report. We'll go from there.   Take care.  

## 2012-06-10 NOTE — Progress Notes (Signed)
Was taking diclofenac then changed to ibuprofen recently.  Still working with a Psychologist, educational, had taken time off prev. Saw a chiropractor prev.  Pain is 80% better today, but was lousy yesterday.  He has popping in her back but this isn't acute.  It can happen with movement.  She has pain just the side of the midline B in the L spine.  Still with occ R sided leg symptoms.   LMP was last week.   Meds, vitals, and allergies reviewed.   ROS: See HPI.  Otherwise, noncontributory.  nad  Back w/o midline pain  No rash  Mildly ttp in B lower L spine lateral paraspinal muscles  SLR neg but tight hamstrings.  SI testing wnl  DTRs wnl BLE

## 2012-07-13 ENCOUNTER — Other Ambulatory Visit: Payer: Self-pay | Admitting: Family Medicine

## 2012-07-13 NOTE — Telephone Encounter (Signed)
Electronic refill request.  Please advise. 

## 2012-07-14 NOTE — Telephone Encounter (Signed)
I thought she changed to ibuprofen, please clarify. Thanks.

## 2012-07-14 NOTE — Telephone Encounter (Signed)
Received refill request electronically from pharmacy. Last office visit 06/10/12. Is it okay to refill medication?

## 2012-07-14 NOTE — Telephone Encounter (Signed)
I thought she changed to ibuprofen, please clarify. Thanks.   

## 2012-07-15 NOTE — Telephone Encounter (Signed)
Left message on patient's voicemail to return call

## 2012-07-15 NOTE — Telephone Encounter (Signed)
Patient says she went to her orthopedist within the last few days and they advised her to take the Diclofenac twice a day on a routine basis for at least a couple of weeks so she has taken it for about a week now.  Prior to that, she said she had taken it sporatically as needed.

## 2012-07-15 NOTE — Telephone Encounter (Signed)
Electronic refill request.  Please advise. 

## 2012-07-16 NOTE — Telephone Encounter (Signed)
Sent.  Stop the ibuprofen in meantime. Thanks.

## 2012-07-30 ENCOUNTER — Telehealth: Payer: Self-pay | Admitting: Family Medicine

## 2012-07-30 MED ORDER — PREDNISONE 10 MG PO TABS: ORAL_TABLET | ORAL | Status: DC

## 2012-07-30 NOTE — Telephone Encounter (Signed)
Sent, have her f/u here if not improved.  Stop all nsaids while on prednisone.  Thanks.  Take with food.

## 2012-07-30 NOTE — Telephone Encounter (Signed)
Patient advised.

## 2012-07-30 NOTE — Telephone Encounter (Signed)
Pt says that Dr. Para March has been seeing her for sciatic nerve pain and that her chiropractor suggested a dose of Prednisone since she does not seem to be getting better.  [facility] [address] [address] [employer].

## 2012-10-01 ENCOUNTER — Ambulatory Visit (INDEPENDENT_AMBULATORY_CARE_PROVIDER_SITE_OTHER): Payer: BC Managed Care – PPO | Admitting: Family Medicine

## 2012-10-01 ENCOUNTER — Encounter: Payer: Self-pay | Admitting: Family Medicine

## 2012-10-01 VITALS — BP 124/88 | HR 85 | Temp 98.2°F | Wt 212.5 lb

## 2012-10-01 DIAGNOSIS — J069 Acute upper respiratory infection, unspecified: Secondary | ICD-10-CM

## 2012-10-01 DIAGNOSIS — N390 Urinary tract infection, site not specified: Secondary | ICD-10-CM | POA: Insufficient documentation

## 2012-10-01 DIAGNOSIS — R300 Dysuria: Secondary | ICD-10-CM

## 2012-10-01 LAB — POCT URINALYSIS DIPSTICK
Bilirubin, UA: NEGATIVE
Glucose, UA: NEGATIVE
Ketones, UA: NEGATIVE
Nitrite, UA: NEGATIVE
Spec Grav, UA: 1.02
Urobilinogen, UA: NEGATIVE
pH, UA: 6.5

## 2012-10-01 MED ORDER — CIPROFLOXACIN HCL 250 MG PO TABS: 250.0000 mg | ORAL_TABLET | Freq: Two times a day (BID) | ORAL | Status: DC

## 2012-10-01 NOTE — Assessment & Plan Note (Signed)
Likely viral, nontoxic, f/u prn.  Supportive care.

## 2012-10-01 NOTE — Progress Notes (Signed)
Dysuria: yes duration of symptoms: this week, worse last night abdominal pain: sore in suprapubic area Fevers:none known, none likely back pain:at baseline Vomiting:no  Yesterday - new sx, nose is stuffy, felt feverish, no cough but ST and post nasal gtt.  HA noted.   Meds, vitals, and allergies reviewed.   ROS: See HPI.  Otherwise negative.    GEN: nad, alert and oriented HEENT: mucous membranes moist, tm wnl, nasal exam stuffy, L max sinus mildly ttp, OP with mild cobblestoning NECK: supple, no LA CV: rrr.  PULM: ctab, no inc wob ABD: soft, +bs, suprapubic area tender EXT: no edema SKIN: no acute rash BACK: no CVA pain

## 2012-10-01 NOTE — Assessment & Plan Note (Signed)
Ucx, fluids, cipro, f/u prn.  Nontoxic.  She agrees.

## 2012-10-01 NOTE — Patient Instructions (Addendum)
Drink plenty of fluids, take tylenol as needed, and gargle with warm salt water for your throat.  Start the antibiotics today.  We'll contact you with your lab report.  Take care.

## 2012-10-04 LAB — URINE CULTURE: Colony Count: >=100000

## 2013-04-15 ENCOUNTER — Telehealth: Payer: Self-pay | Admitting: Family Medicine

## 2013-04-15 NOTE — Telephone Encounter (Addendum)
Sick with sinus pressure for the last month after a URI.  She still has some ear fullness and some ear pain.  She's had alternating/variable sinus pressure.  No cough. No sputum. Some rhinorrhea in the AM.  No fevers.    Meds, vitals, and allergies reviewed.   ROS: See HPI.  Otherwise, noncontributory.  GEN: nad, alert and oriented HEENT: mucous membranes moist, nasal exam w/o erythema, clear discharge noted,  OP with cobblestoning, sluggish TM movement on exam.  No TM erythema. NECK: supple w/o LA CV: rrr.   PULM: ctab, no inc wob EXT: no edema SKIN: no acute rash  A/P: Likely ETD.  Use flonase and f/u prn.  D/w pt .

## 2013-07-29 ENCOUNTER — Encounter: Payer: Self-pay | Admitting: Family Medicine

## 2013-07-29 ENCOUNTER — Ambulatory Visit (INDEPENDENT_AMBULATORY_CARE_PROVIDER_SITE_OTHER): Payer: BC Managed Care – PPO | Admitting: Family Medicine

## 2013-07-29 VITALS — BP 116/80 | HR 62 | Temp 98.0°F | Wt 199.2 lb

## 2013-07-29 DIAGNOSIS — J029 Acute pharyngitis, unspecified: Secondary | ICD-10-CM

## 2013-07-29 DIAGNOSIS — N390 Urinary tract infection, site not specified: Secondary | ICD-10-CM

## 2013-07-29 DIAGNOSIS — R300 Dysuria: Secondary | ICD-10-CM

## 2013-07-29 LAB — POCT URINALYSIS DIPSTICK
Bilirubin, UA: NEGATIVE
Glucose, UA: NEGATIVE
Ketones, UA: NEGATIVE
Nitrite, UA: NEGATIVE
Spec Grav, UA: 1.015
Urobilinogen, UA: NEGATIVE
pH, UA: 6

## 2013-07-29 MED ORDER — CIPROFLOXACIN HCL 250 MG PO TABS: 250.0000 mg | ORAL_TABLET | Freq: Two times a day (BID) | ORAL | Status: DC

## 2013-07-29 NOTE — Progress Notes (Signed)
Pre visit review using our clinic review tool, if applicable. No additional management support is needed unless otherwise documented below in the visit note.  Dysuria:yes, intermittent.  duration of symptoms: a few days abdominal pain:some lower abd pain. fevers:no back pain:no Vomiting:no  She has episodic ST, unclear source, recently.  No taste or voice changes usually, but occ would wake up with a little hoarseness. ST will resolve on its own.  No fevers.  No heartburn. No postnasal gtt or rhinorrhea.  No ST now.    Meds, vitals, and allergies reviewed.   ROS: See HPI.  Otherwise negative.    GEN: nad, alert and oriented HEENT: mucous membranes moist, OP wnl NECK: supple CV: rrr.  PULM: ctab, no inc wob ABD: soft, +bs, suprapubic area mildly tender EXT: no edema SKIN: no acute rash BACK: no CVA pain

## 2013-07-29 NOTE — Patient Instructions (Addendum)
Drink plenty of water and start the antibiotics today.  We'll contact you with your lab report.  Take care.   You could try taking zantac 75mg  twice a day and elevate the head of your bed.

## 2013-07-30 LAB — URINE CULTURE: Colony Count: 50000

## 2013-07-31 DIAGNOSIS — J029 Acute pharyngitis, unspecified: Secondary | ICD-10-CM | POA: Insufficient documentation

## 2013-07-31 NOTE — Assessment & Plan Note (Signed)
Unclear if GERD could be contributing.  No abnormality on exam today.  Can try taking zantac 75mg  twice a day and elevate the head of bed.  F/u vs call back if not better. She agrees.

## 2013-07-31 NOTE — Assessment & Plan Note (Signed)
PO fluids, ucx, start cipro.  See notes on labs.

## 2013-08-01 ENCOUNTER — Other Ambulatory Visit: Payer: Self-pay | Admitting: Family Medicine

## 2013-08-01 MED ORDER — AMOXICILLIN 875 MG PO TABS: 875.0000 mg | ORAL_TABLET | Freq: Two times a day (BID) | ORAL | Status: AC

## 2013-08-29 ENCOUNTER — Telehealth: Payer: Self-pay | Admitting: Family Medicine

## 2013-08-29 ENCOUNTER — Ambulatory Visit (INDEPENDENT_AMBULATORY_CARE_PROVIDER_SITE_OTHER): Payer: BC Managed Care – PPO | Admitting: Family Medicine

## 2013-08-29 ENCOUNTER — Encounter: Payer: Self-pay | Admitting: Family Medicine

## 2013-08-29 VITALS — BP 118/68 | HR 59 | Temp 98.3°F | Ht 65.0 in | Wt 200.8 lb

## 2013-08-29 DIAGNOSIS — L237 Allergic contact dermatitis due to plants, except food: Secondary | ICD-10-CM

## 2013-08-29 DIAGNOSIS — L255 Unspecified contact dermatitis due to plants, except food: Secondary | ICD-10-CM

## 2013-08-29 MED ORDER — TRIAMCINOLONE ACETONIDE 0.025 % EX CREA: 1.0000 "application " | TOPICAL_CREAM | Freq: Every day | CUTANEOUS | Status: DC

## 2013-08-29 NOTE — Telephone Encounter (Signed)
Patient Information:  Caller Name: Gavin PoundDeborah  Phone: [phone]  Patient: Sherry Mendoza, Mariana  Gender: Female  DOB: [birth year]  Age: 48 Years  PCP: Crawford Givensuncan, Graham Clelia Croft(Shaw) [facility](Family Practice)  Pregnant: No  Office Follow Up:  Does the office need to follow up with this patient?: No  Instructions For The Office: N/A   Symptoms  Reason For Call & Symptoms: Pt calling regarding poison ivy on rt eye and lid. Stings and itches. Nickel to Quarter size.  Reviewed Health History In EMR: Yes  Reviewed Medications In EMR: Yes  Reviewed Allergies In EMR: Yes  Reviewed Surgeries / Procedures: Yes  Date of Onset of Symptoms: 08/28/2013  Treatments Tried: Some Benadryl cream.  Treatments Tried Worked: No OB / GYN:  LMP: Unknown  Guideline(s) Used:  Poison Ivy - Oak or [facility]  Disposition Per Guideline:   See Today in Office  Reason For Disposition Reached:   Face, eyes, lips or genitals are involved  Advice Given:  N/A  Patient Will Follow Care Advice:  YES  Appointment Scheduled:  08/29/2013 15:15:00 Appointment Scheduled Provider:  Roxy Mannsower, Marne [facility](Family Practice)

## 2013-08-29 NOTE — Telephone Encounter (Signed)
I will see her then  

## 2013-08-29 NOTE — Patient Instructions (Signed)
I do think you have poison oak type dermatis however- if rash becomes painful please let me know(or if any vision change) Wash any clothing you were gardening in  Try the triamcinolone cream carefully on affected area  Update if not starting to improve in a week or if worsening

## 2013-08-29 NOTE — Progress Notes (Signed)
Subjective:    Patient ID: Sherry Mendoza, female    DOB: 28-Sep-1965, 48 y.o.   MRN: [id number]  HPI Here with rash around eye on R side   Woke up yesterday with redness around her eye -now more inflammed and itchy  Has some around nasal fold and lip on R   She thinks that this is poison ivy  Also recently spray painted furniture on Sat -and got a headache - (lasted all night long)  Did not get paint on her   Does get around poison oak- has had some on L elbow and her R wrist   No change in her vision at all   Right now just itchy  Patient Active Problem List   Diagnosis Date Noted  . Sore throat 07/31/2013  . UTI (urinary tract infection) 10/01/2012  . Low back pain 04/26/2012  . HYPOTHYROIDISM, BORDERLINE 04/10/2010  . SHOULDER PAIN, RIGHT 01/16/2009  . OBESITY 11/19/2007   Past Medical History  Diagnosis Date  . Frequent UTI     in past-with uro w/u and urethral dilation  . Guillain-Barre 06/1998    Guillain-Barre Plasmaphoresis- hospitalized x 1 week at [facility]   Past Surgical History  Procedure Laterality Date  . Tonsillectomy  1985   History  Substance Use Topics  . Smoking status: Never Smoker   . Smokeless tobacco: Not on file  . Alcohol Use: No   No family history on file. Allergies  Allergen Reactions  . Influenza Vaccines     H/o gillian barre syndrome  . Nitrofurantoin     REACTION: ELEVATED LIVER FUNCTIONS  . Sulfonamide Derivatives     REACTION: ITCHING   No current outpatient prescriptions on file prior to visit.   No current facility-administered medications on file prior to visit.      Review of Systems Review of Systems  Constitutional: Negative for fever, appetite change, fatigue and unexpected weight change.  Eyes: Negative for pain and visual disturbance.  Respiratory: Negative for cough and shortness of breath.   Cardiovascular: Negative for cp or palpitations    Gastrointestinal: Negative for nausea, diarrhea and constipation.    Genitourinary: Negative for urgency and frequency.  Skin: Negative for pallor and pos for rash  Neurological: Negative for weakness, light-headedness, numbness and headaches.  Hematological: Negative for adenopathy. Does not bruise/bleed easily.  Psychiatric/Behavioral: Negative for dysphoric mood. The patient is not nervous/anxious.         Objective:   Physical Exam  Constitutional: She appears well-developed and well-nourished. No distress.  HENT:  Head: Normocephalic and atraumatic.  Right Ear: External ear normal.  Left Ear: External ear normal.  Nose: Nose normal.  Mouth/Throat: Oropharynx is clear and moist.  Eyes: Conjunctivae and EOM are normal. Pupils are equal, round, and reactive to light. Right eye exhibits no discharge. Left eye exhibits no discharge. No scleral icterus.  Neck: Normal range of motion. Neck supple.  Cardiovascular: Normal rate and regular rhythm.   Pulmonary/Chest: Effort normal and breath sounds normal.  Lymphadenopathy:    She has no cervical adenopathy.  Skin: Skin is warm and dry. Rash noted. There is erythema.  Erythematous rash with papules and scale over medial R eyelid, nasolabial crease  No excoriations or drainage   Psychiatric: She has a normal mood and affect.          Assessment & Plan:   Problem List Items Addressed This Visit     Musculoskeletal and Integument   Plant allergic contact  dermatitis - Primary     Around R eye and nasal crease/ few spots on L elbow and R wrist  Disc diff between this and zoster (pt will call if rash increases/becomes painful) Gentle cleansing and careful use of triamcinolone .025%  -care not to get in eye  Update if not starting to improve in a week or if worsening

## 2013-08-29 NOTE — Progress Notes (Signed)
Pre visit review using our clinic review tool, if applicable. No additional management support is needed unless otherwise documented below in the visit note. 

## 2013-08-29 NOTE — Assessment & Plan Note (Signed)
Around R eye and nasal crease/ few spots on L elbow and R wrist  Disc diff between this and zoster (pt will call if rash increases/becomes painful) Gentle cleansing and careful use of triamcinolone .025%  -care not to get in eye  Update if not starting to improve in a week or if worsening

## 2013-08-31 ENCOUNTER — Other Ambulatory Visit: Payer: Self-pay | Admitting: Family Medicine

## 2013-09-01 NOTE — Telephone Encounter (Signed)
Electronic refill request.  Medication is not on current meds list.  Please advise.  

## 2013-09-02 NOTE — Telephone Encounter (Signed)
Prev on, okay to continue.  Sent.

## 2014-05-11 ENCOUNTER — Other Ambulatory Visit: Payer: Self-pay

## 2014-05-12 ENCOUNTER — Ambulatory Visit (INDEPENDENT_AMBULATORY_CARE_PROVIDER_SITE_OTHER): Payer: BLUE CROSS/BLUE SHIELD | Admitting: Family Medicine

## 2014-05-12 ENCOUNTER — Other Ambulatory Visit (HOSPITAL_COMMUNITY)
Admission: RE | Admit: 2014-05-12 | Discharge: 2014-05-12 | Disposition: A | Discharge status: Home / Self Care | Payer: BLUE CROSS/BLUE SHIELD | Source: Ambulatory Visit | Attending: Family Medicine | Admitting: Family Medicine

## 2014-05-12 ENCOUNTER — Encounter: Payer: Self-pay | Admitting: Family Medicine

## 2014-05-12 VITALS — BP 132/84 | HR 63 | Temp 98.3°F | Resp 16 | Ht 65.0 in | Wt 185.4 lb

## 2014-05-12 DIAGNOSIS — Z7189 Other specified counseling: Secondary | ICD-10-CM | POA: Insufficient documentation

## 2014-05-12 DIAGNOSIS — Z131 Encounter for screening for diabetes mellitus: Secondary | ICD-10-CM

## 2014-05-12 DIAGNOSIS — Z01411 Encounter for gynecological examination (general) (routine) with abnormal findings: Secondary | ICD-10-CM | POA: Insufficient documentation

## 2014-05-12 DIAGNOSIS — R946 Abnormal results of thyroid function studies: Secondary | ICD-10-CM | POA: Diagnosis not present

## 2014-05-12 DIAGNOSIS — Z23 Encounter for immunization: Secondary | ICD-10-CM

## 2014-05-12 DIAGNOSIS — Z1211 Encounter for screening for malignant neoplasm of colon: Secondary | ICD-10-CM

## 2014-05-12 DIAGNOSIS — R87810 Cervical high risk human papillomavirus (HPV) DNA test positive: Secondary | ICD-10-CM | POA: Insufficient documentation

## 2014-05-12 DIAGNOSIS — Z1151 Encounter for screening for human papillomavirus (HPV): Secondary | ICD-10-CM | POA: Insufficient documentation

## 2014-05-12 DIAGNOSIS — R21 Rash and other nonspecific skin eruption: Secondary | ICD-10-CM | POA: Diagnosis not present

## 2014-05-12 DIAGNOSIS — Z124 Encounter for screening for malignant neoplasm of cervix: Secondary | ICD-10-CM

## 2014-05-12 DIAGNOSIS — Z0000 Encounter for general adult medical examination without abnormal findings: Secondary | ICD-10-CM

## 2014-05-12 DIAGNOSIS — Z13220 Encounter for screening for lipoid disorders: Secondary | ICD-10-CM | POA: Diagnosis not present

## 2014-05-12 DIAGNOSIS — E039 Hypothyroidism, unspecified: Secondary | ICD-10-CM

## 2014-05-12 LAB — LIPID PANEL
Cholesterol: 162 mg/dL (ref 0–200)
HDL: 56.8 mg/dL (ref >39.00)
LDL Cholesterol: 94 mg/dL (ref 0–99)
NonHDL: 105.2
Total CHOL/HDL Ratio: 3
Triglycerides: 58 mg/dL (ref 0.0–149.0)
VLDL: 11.6 mg/dL (ref 0.0–40.0)

## 2014-05-12 LAB — GLUCOSE, RANDOM: Glucose, Bld: 89 mg/dL (ref 70–99)

## 2014-05-12 MED ORDER — FLUOCINONIDE 0.05 % EX CREA: 1.0000 "application " | TOPICAL_CREAM | Freq: Two times a day (BID) | CUTANEOUS | Status: DC

## 2014-05-12 NOTE — Progress Notes (Signed)
CPE- See plan.  Routine anticipatory guidance given to patient.  See health maintenance. Tetanus 2016 Flu not indicated Shingles and PNA not due.   DXA not due.   Mammogram due Pap 2016 Now with some irregular periods, spotting, likely perimenopausal. No breast mass, lump, discharge.  D/w patient NF:[id number]:options for colon cancer screening, including IFOB vs. colonoscopy.  Risks and benefits of both were discussed and patient voiced understanding.  Pt elects VHQ:IONGfor:IFOB.  Living will d/w pt. Husband designated if patient were incapacitated.   Diet and exercise d/w pt.  "I'm doing good."  In weight watchers.  Intentional weight loss.  She had a TSH of 4.5 prev.  We talked about this.  No sx of hypothyroidism and she is still able to lose weight with diet and exercise.  She isn't enthused about taking medicine and if she had an abnormal TSH at this point, we would be "treating the number and not the patient."  She agrees, we elected to defer repeat TSH at this point.   She had some R sided chest wall pain.  It was prev sore to the touch.  She had been jogging more recently.  It started in the last few days, but is clearly better today.    Ankle rash.  She thought it was poison oak initially.  Now on bilateral ankles.  Tried OTC cream and TAC w/o relief.  Itchy.    PMH and SH reviewed  Meds, vitals, and allergies reviewed.   ROS: See HPI.  Otherwise negative.    GEN: nad, alert and oriented HEENT: mucous membranes moist NECK: supple w/o LA, no tmg CV: rrr. R chest wall slightly ttp along the ribs just lateral to midline (this isn't on the breast per se, it appears to be chest wall in origin) PULM: ctab, no inc wob ABD: soft, +bs EXT: no edema SKIN: B flaky rash on the ankles w/o ulceration.  Normal introitus for age, no external lesions, no vaginal discharge, mucosa pink and moist, no vaginal or cervical lesions, no vaginal atrophy, no friaility or hemorrhage, normal uterus size and position, no  adnexal masses or tenderness.  Chaperoned exam.

## 2014-05-12 NOTE — Assessment & Plan Note (Signed)
She had a TSH of 4.5 prev.  We talked about this.  No sx of hypothyroidism and she is still able to lose weight with diet and exercise.  She isn't enthused about taking medicine and if she had an abnormal TSH at this point, we would be "treating the number and not the patient."  She agrees, we elected to defer repeat TSH at this point.

## 2014-05-12 NOTE — Assessment & Plan Note (Signed)
This doesn't look to be fungal (and she failed tx with otc antifungal prev).  Can try lidex.  It may be an eczema variant.  Routine steroid cautions given.  If not improved, then she'll notify me.

## 2014-05-12 NOTE — Patient Instructions (Signed)
Go to the lab on the way out.  We'll contact you with your lab report. Sherry Mendoza has the numbers to call about a mammogram.  The chest wall pain should gradually get better.  Let me know if it doesn't.  Use the lidex cream on the rash and update me as needed.  Take care.  Glad to see you.

## 2014-05-12 NOTE — Assessment & Plan Note (Addendum)
Routine anticipatory guidance given to patient.  See health maintenance. Tetanus 2016 Flu not indicated Shingles and PNA not due.   DXA not due.   Mammogram due, d/w pt.  She'll call about an appointment Pap 2016 Now with some irregular periods, spotting, likely perimenopausal. No breast mass, lump, discharge.  D/w patient ZO:[id number]:options for colon cancer screening, including IFOB vs. colonoscopy.  Risks and benefits of both were discussed and patient voiced understanding.  Pt elects WUJ:WJXBfor:IFOB.  Living will d/w pt. Husband designated if patient were incapacitated.   Diet and exercise d/w pt.  "I'm doing good."  In weight watchers.  Intentional weight loss.

## 2014-05-15 LAB — CYTOLOGY - PAP

## 2014-05-18 ENCOUNTER — Telehealth: Payer: Self-pay | Admitting: Family Medicine

## 2014-05-18 ENCOUNTER — Other Ambulatory Visit: Payer: Self-pay | Admitting: Family Medicine

## 2014-05-18 DIAGNOSIS — IMO0002 Reserved for concepts with insufficient information to code with codable children: Secondary | ICD-10-CM

## 2014-05-18 NOTE — Telephone Encounter (Signed)
Spoke with patient and she will await the call for the referral.

## 2014-05-18 NOTE — Telephone Encounter (Signed)
Please call patient back about her pap smear results.

## 2014-05-22 ENCOUNTER — Telehealth: Payer: Self-pay | Admitting: Family Medicine

## 2014-05-22 NOTE — Telephone Encounter (Signed)
05/22/14-Faxed referral to [facility]. Pt aware they will contact her to set up appt

## 2014-05-22 NOTE — Telephone Encounter (Signed)
Pt called checking on her referral to gyn She see vandailen @ [facility]

## 2014-07-20 ENCOUNTER — Ambulatory Visit (INDEPENDENT_AMBULATORY_CARE_PROVIDER_SITE_OTHER): Payer: BLUE CROSS/BLUE SHIELD | Admitting: Family Medicine

## 2014-07-20 ENCOUNTER — Encounter: Payer: Self-pay | Admitting: Family Medicine

## 2014-07-20 VITALS — BP 112/72 | HR 60 | Temp 98.6°F | Wt 183.8 lb

## 2014-07-20 DIAGNOSIS — R21 Rash and other nonspecific skin eruption: Secondary | ICD-10-CM | POA: Diagnosis not present

## 2014-07-20 MED ORDER — FLUOCINONIDE 0.05 % EX CREA
1.0000 | TOPICAL_CREAM | Freq: Two times a day (BID) | CUTANEOUS | Status: DC
Start: 2014-07-20 — End: 2014-10-06

## 2014-07-20 MED ORDER — TERBINAFINE HCL 1 % EX CREA: 1.0000 "application " | TOPICAL_CREAM | Freq: Two times a day (BID) | CUTANEOUS | Status: DC

## 2014-07-20 NOTE — Progress Notes (Signed)
Pre visit review using our clinic review tool, if applicable. No additional management support is needed unless otherwise documented below in the visit note.  R ankle rash, itches.  Lidex helps some with the itching.  It will look like it is close to healing then will erupt again.   She tried OTC antifungal med- clotrimazole- but it burned when she put it on.    Meds, vitals, and allergies reviewed.   ROS: See HPI.  Otherwise, noncontributory.  nad Rash on R ankle but not the foot.  Some central clearing.  Red, blanching.  Some satellite lesions.  No ulceration.

## 2014-07-20 NOTE — Patient Instructions (Signed)
Use lidex, add on OTC lamisil and update me if not better next week.  We can get you set up with dermatology if needed.

## 2014-07-20 NOTE — Assessment & Plan Note (Signed)
Looks to have fungal component.  Try lamisil cream, continue topical steroid.  If not better, then refer to derm.  She agrees.

## 2014-10-06 ENCOUNTER — Ambulatory Visit (INDEPENDENT_AMBULATORY_CARE_PROVIDER_SITE_OTHER): Payer: BLUE CROSS/BLUE SHIELD | Admitting: Family Medicine

## 2014-10-06 ENCOUNTER — Encounter: Payer: Self-pay | Admitting: Family Medicine

## 2014-10-06 VITALS — BP 120/80 | HR 66 | Temp 98.1°F | Wt 182.0 lb

## 2014-10-06 DIAGNOSIS — J0110 Acute frontal sinusitis, unspecified: Secondary | ICD-10-CM

## 2014-10-06 MED ORDER — AMOXICILLIN-POT CLAVULANATE 875-125 MG PO TABS: 1.0000 | ORAL_TABLET | Freq: Two times a day (BID) | ORAL | Status: DC

## 2014-10-06 MED ORDER — FLUOCINONIDE 0.05 % EX CREA
1.0000 | TOPICAL_CREAM | Freq: Two times a day (BID) | CUTANEOUS | Status: DC
Start: 2014-10-06 — End: 2015-07-20

## 2014-10-06 MED ORDER — FLUTICASONE PROPIONATE 50 MCG/ACT NA SUSP: 2.0000 | Freq: Every day | NASAL | Status: DC

## 2014-10-06 NOTE — Progress Notes (Signed)
Pre visit review using our clinic review tool, if applicable. No additional management support is needed unless otherwise documented below in the visit note.  duration of symptoms: ~1 week rhinorrhea:oc Congestion: yes ear pain: R ear pain sore throat: no Frontal> max pain.  Cough: occ, mild Myalgias: no diffuse aches.   No fevers.   other concerns: she needed refill on lidex to have on hand.  She was getting better then had a "second sickening"   ROS: See HPI.  Otherwise negative.    Meds, vitals, and allergies reviewed.   GEN: nad, alert and oriented HEENT: mucous membranes moist, TM w/o erythema, nasal epithelium injected, OP with cobblestoning, frontal sinuses ttp NECK: supple w/o LA CV: rrr. PULM: ctab, no inc wob ABD: soft, +bs EXT: no edema

## 2014-10-06 NOTE — Patient Instructions (Signed)
Start augmentin, use flonase, and try to get some rest.  Drink plenty of fluids.  Glad to see you.

## 2014-10-08 DIAGNOSIS — J019 Acute sinusitis, unspecified: Secondary | ICD-10-CM | POA: Insufficient documentation

## 2014-10-08 NOTE — Assessment & Plan Note (Addendum)
Nontoxic, start augmentin, use flonase, and try to get some rest.  Drink plenty of fluids.  F/u prn. She agrees.

## 2014-10-30 ENCOUNTER — Other Ambulatory Visit: Payer: Self-pay | Admitting: Family Medicine

## 2015-04-03 ENCOUNTER — Encounter: Payer: Self-pay | Admitting: *Deleted

## 2015-04-03 ENCOUNTER — Ambulatory Visit (INDEPENDENT_AMBULATORY_CARE_PROVIDER_SITE_OTHER): Payer: BLUE CROSS/BLUE SHIELD | Admitting: Family Medicine

## 2015-04-03 ENCOUNTER — Encounter: Payer: Self-pay | Admitting: Family Medicine

## 2015-04-03 VITALS — BP 126/84 | HR 68 | Temp 98.4°F | Wt 184.0 lb

## 2015-04-03 DIAGNOSIS — J029 Acute pharyngitis, unspecified: Secondary | ICD-10-CM

## 2015-04-03 DIAGNOSIS — J111 Influenza due to unidentified influenza virus with other respiratory manifestations: Secondary | ICD-10-CM | POA: Diagnosis not present

## 2015-04-03 DIAGNOSIS — R69 Illness, unspecified: Principal | ICD-10-CM

## 2015-04-03 LAB — POCT INFLUENZA A/B
Influenza A, POC: NEGATIVE
Influenza B, POC: NEGATIVE

## 2015-04-03 LAB — POCT RAPID STREP A (OFFICE): Rapid Strep A Screen: NEGATIVE

## 2015-04-03 MED ORDER — GUAIFENESIN-CODEINE 100-10 MG/5ML PO SYRP: 5.0000 mL | ORAL_SOLUTION | Freq: Two times a day (BID) | ORAL | Status: DC | PRN

## 2015-04-03 NOTE — Progress Notes (Signed)
Pre visit review using our clinic review tool, if applicable. No additional management support is needed unless otherwise documented below in the visit note. 

## 2015-04-03 NOTE — Progress Notes (Signed)
BP 126/84 mmHg  Pulse 68  Temp(Src) 98.4 F (36.9 C) (Oral)  Wt 184 lb (83.462 kg)  SpO2 99%  LMP 03/07/2015   CC: URI  Subjective:    Patient ID: Sherry Mendoza, female    DOB: 1965/04/01, 50 y.o.   MRN: [id number]  HPI: LAKETRA BOWDISH is a 50 y.o. female presenting on 04/03/2015 for Cough   3d h/o ST, low grade fever to 100.3, hoarse voice with chest tightness and dyspnea, HA, cough, congestion with body aches. Dry cough with PNDrainage.   No ear or tooth pain, abd pain, nausea, new rashes. No numbness, paresthesias, one sided weakness.   Taking mucinex, ibuprofen.  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   H/o Guillain-barre syndrome 2000  Relevant past medical, surgical, family and social history reviewed and updated as indicated. Interim medical history since our last visit reviewed. Allergies and medications reviewed and updated. Current Outpatient Prescriptions on File Prior to Visit  Medication Sig  . fluticasone (FLONASE) 50 MCG/ACT nasal spray Place 2 sprays into both nostrils daily.  . diclofenac (VOLTAREN) 75 MG EC tablet TAKE 1 TABLET BY MOUTH TWO TIMES A DAY AS NEEDED SPARINGLY (Patient not taking: Reported on 04/03/2015)  . fluocinonide cream (LIDEX) 0.05 % Apply 1 application topically 2 (two) times daily. (Patient not taking: Reported on 04/03/2015)   No current facility-administered medications on file prior to visit.    Review of Systems Per HPI unless specifically indicated in ROS section     Objective:    BP 126/84 mmHg  Pulse 68  Temp(Src) 98.4 F (36.9 C) (Oral)  Wt 184 lb (83.462 kg)  SpO2 99%  LMP 03/07/2015  Wt Readings from Last 3 Encounters:  04/03/15 184 lb (83.462 kg)  10/06/14 182 lb (82.555 kg)  07/20/14 183 lb 12 oz (83.348 kg)    Physical Exam  Constitutional: She appears well-developed and well-nourished. No distress.  Tired, nontoxic appearing  HENT:  Head:  Normocephalic and atraumatic.  Right Ear: Hearing, tympanic membrane, external ear and ear canal normal.  Left Ear: Hearing, tympanic membrane, external ear and ear canal normal.  Nose: Mucosal edema present. No rhinorrhea. Right sinus exhibits no maxillary sinus tenderness and no frontal sinus tenderness. Left sinus exhibits no maxillary sinus tenderness and no frontal sinus tenderness.  Mouth/Throat: Uvula is midline and mucous membranes are normal. Posterior oropharyngeal edema and posterior oropharyngeal erythema present. No oropharyngeal exudate or tonsillar abscesses.  Nasal mucosal congestion/inflammation R>L ?polyp Very raw posterior oropharynx without exudates  Eyes: Conjunctivae and EOM are normal. Pupils are equal, round, and reactive to light. No scleral icterus.  Neck: Normal range of motion. Neck supple.  Cardiovascular: Normal rate, regular rhythm, normal heart sounds and intact distal pulses.   No murmur heard. Pulmonary/Chest: Effort normal and breath sounds normal. No respiratory distress. She has no wheezes. She has no rales.  Lungs clear, dry cough present  Lymphadenopathy:    She has no cervical adenopathy.  Skin: Skin is warm and dry. No rash noted.  Nursing note and vitals reviewed.  Results for orders placed or performed in visit on 04/03/15  POCT Influenza A/B  Result Value Ref Range   Influenza A, POC Negative Negative   Influenza B, POC Negative Negative  POCT rapid strep A  Result Value Ref Range   Rapid Strep A Screen Negative Negative      Assessment & Plan:  Problem List Items Addressed This Visit    Influenza-like illness - Primary    Flu swab negative today Regardless anticipate flu like illness - discussed supportive care. Push fluids and rest. Continue mucinex and ibuprofen, add cheratussin cough syrup. Out of work until fever free for 24 hours. Red flags to return discussed RST negative today      Relevant Orders   POCT Influenza A/B  (Completed)    Other Visit Diagnoses    Sore throat        Relevant Orders    POCT rapid strep A (Completed)        Follow up plan: Return if symptoms worsen or fail to improve.

## 2015-04-03 NOTE — Patient Instructions (Signed)
You have a flu like illness. Antibiotics are not needed for this.  Viral infections usually take 7-10 days to resolve.  The cough can last a few weeks to go away. Use medication as prescribed: cheratussin cough syrup for night time. Push fluids and plenty of rest. May continue ibuprofen and mucinex as needed. Drink mucinex with large glass of water to help mobilize mucous. Please return if you are not improving as expected, or if you have high fevers (>101.5) or difficulty swallowing or worsening productive cough. Call clinic with questions.  Good to see you today. I hope you start feeling better soon.

## 2015-04-03 NOTE — Assessment & Plan Note (Addendum)
Flu swab negative today Regardless anticipate flu like illness - discussed supportive care. Push fluids and rest. Continue mucinex and ibuprofen, add cheratussin cough syrup. Out of work until fever free for 24 hours. Red flags to return discussed RST negative today

## 2015-04-18 ENCOUNTER — Emergency Department
Admission: EM | Admit: 2015-04-18 | Discharge: 2015-04-18 | Disposition: A | Discharge status: Home / Self Care | Payer: Worker's Compensation | Attending: Emergency Medicine | Admitting: Emergency Medicine

## 2015-04-18 ENCOUNTER — Encounter: Payer: Self-pay | Admitting: Medical Oncology

## 2015-04-18 ENCOUNTER — Emergency Department: Payer: Worker's Compensation

## 2015-04-18 DIAGNOSIS — M79662 Pain in left lower leg: Secondary | ICD-10-CM | POA: Insufficient documentation

## 2015-04-18 DIAGNOSIS — M79605 Pain in left leg: Secondary | ICD-10-CM

## 2015-04-18 LAB — CBC WITH DIFFERENTIAL/PLATELET
Basophils Absolute: 0 10*3/uL (ref 0–0.1)
Basophils Relative: 1 %
Eosinophils Absolute: 0 10*3/uL (ref 0–0.7)
Eosinophils Relative: 1 %
HCT: 37.6 % (ref 35.0–47.0)
Hemoglobin: 12.7 g/dL (ref 12.0–16.0)
Lymphocytes Relative: 32 %
Lymphs Abs: 2.1 10*3/uL (ref 1.0–3.6)
MCH: 29.5 pg (ref 26.0–34.0)
MCHC: 33.7 g/dL (ref 32.0–36.0)
MCV: 87.6 fL (ref 80.0–100.0)
Monocytes Absolute: 0.5 10*3/uL (ref 0.2–0.9)
Monocytes Relative: 8 %
Neutro Abs: 3.9 10*3/uL (ref 1.4–6.5)
Neutrophils Relative %: 58 %
Platelets: 249 10*3/uL (ref 150–440)
RBC: 4.29 MIL/uL (ref 3.80–5.20)
RDW: 12.4 % (ref 11.5–14.5)
WBC: 6.6 10*3/uL (ref 3.6–11.0)

## 2015-04-18 LAB — BASIC METABOLIC PANEL
Anion gap: 6 (ref 5–15)
BUN: 17 mg/dL (ref 6–20)
CO2: 24 mmol/L (ref 22–32)
Calcium: 9 mg/dL (ref 8.9–10.3)
Chloride: 106 mmol/L (ref 101–111)
Creatinine, Ser: 0.54 mg/dL (ref 0.44–1.00)
GFR calc Af Amer: >60 mL/min (ref >60)
GFR calc non Af Amer: >60 mL/min (ref >60)
Glucose, Bld: 109 mg/dL — ABNORMAL HIGH (ref 65–99)
Potassium: 4.3 mmol/L (ref 3.5–5.1)
Sodium: 136 mmol/L (ref 135–145)

## 2015-04-18 NOTE — ED Notes (Signed)
Pt transported to ultrasound.

## 2015-04-18 NOTE — ED Notes (Signed)
Pt reports she was at work Friday when she made a sudden movement and suddenly had a sharp pain to left calf, pt now reports area is swelling, red and tender to touch, pt went to urgent care and was told it was difficult to find pulse in her foot.

## 2015-04-18 NOTE — ED Notes (Signed)
Pt reports left leg pain x 6 days after felling a pulling sensation at work. Pt now reports swelling of that extremity

## 2015-04-18 NOTE — ED Notes (Signed)
Dr Darnelle Catalanmalinda could not print out discharge inst. pt signed out on paper form.  Crutches given to pt.  Pt alert.  Family with pt.

## 2015-04-18 NOTE — ED Provider Notes (Signed)
[facility] Emergency Department Provider Note  ____________________________________________  Time seen: Approximately 1:28 PM  I have reviewed the triage vital signs and the nursing notes.   HISTORY  Chief Complaint Leg Pain    HPI Sherry Mendoza is a 50 y.o. female reports she was jumping on Friday and felt a pop in her left leg behind the knee and in the calf and then experienced severe pain and the pain has remained at present is very difficult to walk she reports she could not dorsiflex her foot on Friday or Saturday she can now. She has developed some redness and swelling in the leg and a bruise in the anterior part leg since then. Pain was severe and is still pretty bad achy and worse with movement  Past Medical History  Diagnosis Date  . Frequent UTI     in past-with uro w/u and urethral dilation  . Guillain-Barre (HCC) 06/1998    Guillain-Barre Plasmaphoresis- [facility]    Patient Active Problem List   Diagnosis Date Noted  . Influenza-like illness 04/03/2015  . ASCUS with positive high risk HPV 05/18/2014  . Routine general medical examination at a health care facility 05/12/2014  . Advance care planning 05/12/2014  . Rash and nonspecific skin eruption 05/12/2014  . Borderline hypothyroidism 04/10/2010  . SHOULDER PAIN, RIGHT 01/16/2009    Past Surgical History  Procedure Laterality Date  . Tonsillectomy  1985    Current Outpatient Rx  Name  Route  Sig  Dispense  Refill  . diclofenac (VOLTAREN) 75 MG EC tablet      TAKE 1 TABLET BY MOUTH TWO TIMES A DAY AS NEEDED SPARINGLY Patient not taking: Reported on 04/03/2015   60 tablet   1   . fluocinonide cream (LIDEX) 0.05 %   Topical   Apply 1 application topically 2 (two) times daily. Patient not taking: Reported on 04/03/2015   30 g   0   . fluticasone (FLONASE) 50 MCG/ACT nasal spray   Each Nare   Place 2 sprays into both nostrils daily.   16 g   1   .  guaiFENesin-codeine (CHERATUSSIN AC) 100-10 MG/5ML syrup   Oral   Take 5 mLs by mouth 2 (two) times daily as needed for cough (sedation precautions).   140 mL   0     Allergies Influenza vaccines; Nitrofurantoin; and Sulfonamide derivatives  Family History  Problem Relation Age of Onset  . Adopted: Yes  . Family history unknown: Yes    Social History Social History  Substance Use Topics  . Smoking status: Never Smoker   . Smokeless tobacco: Never Used  . Alcohol Use: No    Review of Systems Constitutional: No fever/chills Eyes: No visual changes. ENT: No sore throat. Cardiovascular: Denies chest pain. Respiratory: Denies shortness of breath. Gastrointestinal: No abdominal pain.  No nausea, no vomiting.  No diarrhea.  No constipation. Genitourinary: Negative for dysuria. Musculoskeletal: Negative for back pain. Skin: Negative for rash. Neurological: Negative for headaches, focal weakness or numbness.  10-point ROS otherwise negative.  ____________________________________________   PHYSICAL EXAM:  VITAL SIGNS: ED Triage Vitals  Enc Vitals Group     BP 04/18/15 1310 144/77 mmHg     Pulse Rate 04/18/15 1310 66     Resp 04/18/15 1310 19     Temp 04/18/15 1310 97.9 F (36.6 C)     Temp Source 04/18/15 1310 Oral     SpO2 04/18/15 1310 100 %  Weight 04/18/15 1310 180 lb (81.647 kg)     Height 04/18/15 1310  (1.651 m)     Head Cir --      Peak Flow --      Pain Score 04/18/15 1310 7     Pain Loc --      Pain Edu? --      Excl. in GC? --     Constitutional: Alert and oriented. Well appearing and in no acute distress. Eyes: Conjunctivae are normal. PERRL. EOMI. Head: Atraumatic. Nose: No congestion/rhinnorhea. Mouth/Throat: Mucous membranes are moist.   Neck: No stridor.  Cardiovascular: Normal rate, regular rhythm. Grossly normal heart sounds.  Good peripheral circulation. Respiratory: Normal respiratory effort.  No retractions. Lungs  CTAB. Musculoskeletal: Left lower leg is slightly swollen and there is a bruise over the shin fairly large about the size of 3 hand breaths vertically and 1 wide there is some faint indistinct redness high in the knee is tender in the lateral and posterior part of the leg below the knee. Pulses are intact in the foot both dorsalis pedis and posterior tibial. Neurologic:  Normal speech and language. No gross focal neurologic deficits are appreciated. No gait instability. Skin:  Skin is warm, dry and intact. No rash noted. Psychiatric: Mood and affect are normal. Speech and behavior are normal.  ____________________________________________   LABS (all labs ordered are listed, but only abnormal results are displayed)  Labs Reviewed  BASIC METABOLIC PANEL - Abnormal; Notable for the following:    Glucose, Bld 109 (*)    All other components within normal limits  CBC WITH DIFFERENTIAL/PLATELET   ____________________________________________  EKG   ____________________________________________  RADIOLOGY  Ultrasound shows no DVT per radiology ____________________________________________   PROCEDURES    ____________________________________________   INITIAL IMPRESSION / ASSESSMENT AND PLAN / ED COURSE  Pertinent labs & imaging results that were available during my care of the patient were reviewed by me and considered in my medical decision making (see chart for details).   ____________________________________________   FINAL CLINICAL IMPRESSION(S) / ED DIAGNOSES  Final diagnoses:  Pain of left lower extremity      Arnaldo Natal, MD 04/18/15 2155

## 2015-04-18 NOTE — Discharge Instructions (Signed)
Musculoskeletal Pain Musculoskeletal pain is muscle and boney aches and pains. These pains can occur in any part of the body. Your caregiver may treat you without knowing the cause of the pain. They may treat you if blood or urine tests, X-rays, and other tests were normal.  CAUSES There is often not a definite cause or reason for these pains. These pains may be caused by a type of germ (virus). The discomfort may also come from overuse. Overuse includes working out too hard when your body is not fit. Boney aches also come from weather changes. Bone is sensitive to atmospheric pressure changes. HOME CARE INSTRUCTIONS   Ask when your test results will be ready. Make sure you get your test results.  Only take over-the-counter or prescription medicines for pain, discomfort, or fever as directed by your caregiver. If you were given medications for your condition, do not drive, operate machinery or power tools, or sign legal documents for 24 hours. Do not drink alcohol. Do not take sleeping pills or other medications that may interfere with treatment.  Continue all activities unless the activities cause more pain. When the pain lessens, slowly resume normal activities. Gradually increase the intensity and duration of the activities or exercise.  During periods of severe pain, bed rest may be helpful. Lay or sit in any position that is comfortable.  Putting ice on the injured area.  Put ice in a bag.  Place a towel between your skin and the bag.  Leave the ice on for 15 to 20 minutes, 3 to 4 times a day.  Follow up with your caregiver for continued problems and no reason can be found for the pain. If the pain becomes worse or does not go away, it may be necessary to repeat tests or do additional testing. Your caregiver may need to look further for a possible cause. SEEK IMMEDIATE MEDICAL CARE IF:  You have pain that is getting worse and is not relieved by medications.  You develop chest pain  that is associated with shortness or breath, sweating, feeling sick to your stomach (nauseous), or throw up (vomit).  Your pain becomes localized to the abdomen.  You develop any new symptoms that seem different or that concern you. MAKE SURE YOU:   Understand these instructions.  Will watch your condition.  Will get help right away if you are not doing well or get worse.   This information is not intended to replace advice given to you by your health care provider. Make sure you discuss any questions you have with your health care provider.   Document Released: 01/20/2005 Document Revised: 04/14/2011 Document Reviewed: 09/24/2012 Elsevier Interactive Patient Education [employer]2016 Elsevier Inc.   I believe she probably ruptured your plantaris tendon. You can use the Motrin up to 800 mg 3 times a day with food for 45 or 6 days. Use the crutches if you need them. Please call Dr. Ernest PineHooten for follow-up if you're better in about a week you do not have to see him but just let him know that. Return if you get worse have worse pain swelling numbness or color changes in the legs.

## 2015-05-21 ENCOUNTER — Telehealth: Payer: BLUE CROSS/BLUE SHIELD | Admitting: Family

## 2015-05-21 DIAGNOSIS — A499 Bacterial infection, unspecified: Secondary | ICD-10-CM

## 2015-05-21 DIAGNOSIS — L570 Actinic keratosis: Secondary | ICD-10-CM | POA: Diagnosis not present

## 2015-05-21 DIAGNOSIS — N390 Urinary tract infection, site not specified: Secondary | ICD-10-CM

## 2015-05-21 DIAGNOSIS — L578 Other skin changes due to chronic exposure to nonionizing radiation: Secondary | ICD-10-CM | POA: Diagnosis not present

## 2015-05-21 MED ORDER — CIPROFLOXACIN HCL 250 MG PO TABS: 250.0000 mg | ORAL_TABLET | Freq: Two times a day (BID) | ORAL | Status: DC

## 2015-05-21 NOTE — Progress Notes (Signed)

## 2015-06-21 DIAGNOSIS — R87613 High grade squamous intraepithelial lesion on cytologic smear of cervix (HGSIL): Secondary | ICD-10-CM | POA: Diagnosis not present

## 2015-06-21 DIAGNOSIS — R87612 Low grade squamous intraepithelial lesion on cytologic smear of cervix (LGSIL): Secondary | ICD-10-CM | POA: Diagnosis not present

## 2015-07-12 DIAGNOSIS — R87810 Cervical high risk human papillomavirus (HPV) DNA test positive: Secondary | ICD-10-CM | POA: Diagnosis not present

## 2015-07-12 DIAGNOSIS — R87613 High grade squamous intraepithelial lesion on cytologic smear of cervix (HGSIL): Secondary | ICD-10-CM | POA: Diagnosis not present

## 2015-07-12 DIAGNOSIS — N72 Inflammatory disease of cervix uteri: Secondary | ICD-10-CM | POA: Diagnosis not present

## 2015-07-12 DIAGNOSIS — R87612 Low grade squamous intraepithelial lesion on cytologic smear of cervix (LGSIL): Secondary | ICD-10-CM | POA: Diagnosis not present

## 2015-07-20 ENCOUNTER — Encounter: Payer: Self-pay | Admitting: Family Medicine

## 2015-07-20 ENCOUNTER — Ambulatory Visit (INDEPENDENT_AMBULATORY_CARE_PROVIDER_SITE_OTHER): Payer: BLUE CROSS/BLUE SHIELD | Admitting: Family Medicine

## 2015-07-20 VITALS — BP 124/82 | HR 59 | Temp 98.4°F | Ht 65.0 in | Wt 186.8 lb

## 2015-07-20 DIAGNOSIS — G43009 Migraine without aura, not intractable, without status migrainosus: Secondary | ICD-10-CM | POA: Diagnosis not present

## 2015-07-20 MED ORDER — SUMATRIPTAN SUCCINATE 100 MG PO TABS: ORAL_TABLET | ORAL | Status: DC

## 2015-07-20 NOTE — Progress Notes (Signed)
Subjective:    Patient ID: Sherry Mendoza, female    DOB: 09/07/1965, 50 y.o.   MRN: [id number]  HPI   [age] year old female patient with history of headaches for 20 years ( never diagnosed with migraine officially, only had 3-4 a year) of Dr. Lianne Bushyuncan's presents with increasing frequency of headaches in last 6 months.  She has had 2 headaches a week in last weeks, prior to that  2 a month. Throbbing pain  Lasting now 1-2 days. Neck is sore as well.  Unilateral, eye waters, nausea ( no emesis), sensitive to light. She takes ibuprofen 800 mg as needed, lies down in dark room. Has tried caffeine.  Has tried tramadol.. No relief.   No change in diet, job has changed some with increase in stress, going in and out of doors. Minimal water, does not skip meals, sleeping well at night.  Walking some.  Social History /Family History/Past Medical History reviewed and updated if needed.   Review of Systems  Constitutional: Negative for fever and fatigue.  HENT: Negative for ear pain.   Eyes: Negative for pain.  Respiratory: Negative for chest tightness and shortness of breath.   Cardiovascular: Negative for chest pain, palpitations and leg swelling.  Gastrointestinal: Negative for abdominal pain.  Genitourinary: Negative for dysuria.       Objective:   Physical Exam  Constitutional: She is oriented to person, place, and time. Vital signs are normal. She appears well-developed and well-nourished. She is cooperative.  Non-toxic appearance. She does not appear ill. No distress.  HENT:  Head: Normocephalic.  Right Ear: Hearing, tympanic membrane, external ear and ear canal normal. Tympanic membrane is not erythematous, not retracted and not bulging.  Left Ear: Hearing, tympanic membrane, external ear and ear canal normal. Tympanic membrane is not erythematous, not retracted and not bulging.  Nose: No mucosal edema or rhinorrhea. Right sinus exhibits no maxillary sinus tenderness and no frontal  sinus tenderness. Left sinus exhibits no maxillary sinus tenderness and no frontal sinus tenderness.  Mouth/Throat: Uvula is midline, oropharynx is clear and moist and mucous membranes are normal.  Eyes: Conjunctivae, EOM and lids are normal. Pupils are equal, round, and reactive to light. Lids are everted and swept, no foreign bodies found.  Neck: Trachea normal and normal range of motion. Neck supple. Carotid bruit is not present. No thyroid mass and no thyromegaly present.  Cardiovascular: Normal rate, regular rhythm, S1 normal, S2 normal, normal heart sounds, intact distal pulses and normal pulses.  Exam reveals no gallop and no friction rub.   No murmur heard. Pulmonary/Chest: Effort normal and breath sounds normal. No tachypnea. No respiratory distress. She has no decreased breath sounds. She has no wheezes. She has no rhonchi. She has no rales.  Abdominal: Soft. Normal appearance and bowel sounds are normal. There is no tenderness.  Neurological: She is alert and oriented to person, place, and time. She has normal strength and normal reflexes. No cranial nerve deficit or sensory deficit. She exhibits normal muscle tone. She displays a negative Romberg sign. Coordination and gait normal. GCS eye subscore is 4. GCS verbal subscore is 5. GCS motor subscore is 6.  Nml cerebellar exam   No papilledema  Skin: Skin is warm, dry and intact. No rash noted.  Psychiatric: She has a normal mood and affect. Her speech is normal and behavior is normal. Judgment and thought content normal. Her mood appears not anxious. Cognition and memory are normal. Cognition and memory are  not impaired. She does not exhibit a depressed mood. She exhibits normal recent memory and normal remote memory.          Assessment & Plan:

## 2015-07-20 NOTE — Patient Instructions (Signed)
Work on healthy lifestyle changes,  Keep headache diary to determine triggers  Trial of sumatriptan for acute relief.  If occuring > 2 times a month.. Make appt to determine if prophylactic medication indicated.

## 2015-07-20 NOTE — Assessment & Plan Note (Signed)
No red flags, nml neuro exam. No indication for imaging at this time.  Discussed lifestyle changes, headache diary to determine triggers  Trial of sumatriptan for acute relief.  If occuring > 2 times a month.. Make appt to determine if prophylactic medication indicated.

## 2015-07-20 NOTE — Progress Notes (Signed)
Pre visit review using our clinic review tool, if applicable. No additional management support is needed unless otherwise documented below in the visit note. 

## 2015-07-25 DIAGNOSIS — L570 Actinic keratosis: Secondary | ICD-10-CM | POA: Diagnosis not present

## 2015-07-25 DIAGNOSIS — L578 Other skin changes due to chronic exposure to nonionizing radiation: Secondary | ICD-10-CM | POA: Diagnosis not present

## 2015-08-06 ENCOUNTER — Telehealth: Payer: Self-pay | Admitting: Family Medicine

## 2015-08-06 MED ORDER — CEPHALEXIN 500 MG PO CAPS: 500.0000 mg | ORAL_CAPSULE | Freq: Two times a day (BID) | ORAL | Status: DC

## 2015-08-06 NOTE — Telephone Encounter (Signed)
Start keflex and f/u if not better soon, or if worsening.  Thanks.  Sent.

## 2015-08-06 NOTE — Telephone Encounter (Signed)
Patient Name: Sherry DashDEBORAH Snellgrove  DOB: Nov 23, 1965    Initial Comment caller states she has burning urination   Nurse Assessment  Nurse: Scarlette ArStandifer, RN, Heather Date/Time (Eastern Time): 08/06/2015 8:59:59 AM  Confirm and document reason for call. If symptomatic, describe symptoms. You must click the next button to save text entered. ---Caller states that she is having frequent urination and burning, this started yesterday afternoon.  Has the patient traveled out of the country within the last 30 days? ---Not Applicable  Does the patient have any new or worsening symptoms? ---Yes  Will a triage be completed? ---Yes  Related visit to physician within the last 2 weeks? ---No  Does the PT have any chronic conditions? (i.e. diabetes, asthma, etc.) ---Yes  List chronic conditions. ---See MR  Is the patient pregnant or possibly pregnant? (Ask all females between the ages of 5612-55) ---No  Is this a behavioral health or substance abuse call? ---No     Guidelines    Guideline Title Affirmed Question Affirmed Notes  Urination Pain - Female Age > 50 years    Final Disposition User   See Physician within 24 Hours Standifer, RN, Insurance underwriterHeather    Comments  Caller states that she would rather have medication called in, she is not in the area and she has UTI's frequently and know what they feel like. Please call her and let her know what the doctor wants her to do.   Referrals  GO TO FACILITY REFUSED   Disagree/Comply: Comply

## 2015-08-06 NOTE — Telephone Encounter (Signed)
Patient advised.

## 2015-10-19 DIAGNOSIS — H1031 Unspecified acute conjunctivitis, right eye: Secondary | ICD-10-CM | POA: Diagnosis not present

## 2015-11-19 ENCOUNTER — Telehealth: Payer: Self-pay

## 2015-11-19 NOTE — Telephone Encounter (Signed)
PLEASE NOTE: All timestamps contained within this report are represented as [place] Time. CONFIDENTIALTY NOTICE: This fax transmission is intended only for the addressee. It contains information that is legally privileged, confidential or otherwise protected from use or disclosure. If you are not the intended recipient, you are strictly prohibited from reviewing, disclosing, copying using or disseminating any of this information or taking any action in reliance on or regarding this information. If you have received this fax in error, please notify us immediately by telephone so that we can arrange for its return to us. Phone: [phone], Toll-Free: [phone], Fax: [phone] Page: 1 of 2 Call Id: [id number] [facility] Night - Client TELEPHONE ADVICE RECORD [facility] Patient Name: Sherry DashDEBORAH Mendoza Gender: Female DOB: [birth year] Age: [age] Y 5 M 1 D Return Phone Number: [phone] (Primary) Address: City/State/Zip: [place] Client [facility] Client [facility] - Night Physician Raechel Acheuncan, Shaw - MD Contact Type Call Who Is Calling Patient / Member / Family / Caregiver Call Type Triage / Clinical Caller Name Bing PlumeLee Shipes Relationship To Patient Spouse Return Phone Number [phone] (Primary) Chief Complaint Headache Reason for Call Symptomatic / Request for Health Information Initial Comment Spouse has had a migraine for a couple of days. PreDisposition Home Care Translation No Nurse Assessment Nurse: Solocinski, RN, Beth Date/Time (Eastern Time): 11/17/2015 6:52:10 PM Confirm and document reason for call. If symptomatic, describe symptoms. You must click the next button to save text entered. ---Caller states she has bad migraines headaches past couple of days. Caller states she was give sumatriptan 100 mg but it is not helping much but then comes back and this last time she  took it did not help at all. Caller states she has pain is 8-9/10 and severe pain right eye along with head pain to whole right side of head. Caller states she has a runny nose and used sudafed but it did not help. Has the patient traveled out of the country within the last 30 days? ---No Does the patient have any new or worsening symptoms? ---Yes Will a triage be completed? ---Yes Related visit to physician within the last 2 weeks? ---No Does the PT have any chronic conditions? (i.e. diabetes, asthma, etc.) ---Yes List chronic conditions. ---migraine Is the patient pregnant or possibly pregnant? (Ask all females between the ages of 7812-55) ---No Is this a behavioral health or substance abuse call? ---No Guidelines Guideline Title Affirmed Question Affirmed Notes Nurse Date/Time (Eastern Time) Headache Severe pain in one eye Solocinski, RN, [name] 11/17/2015 6:59:41 PM Disp. Time Lamount Cohen(Eastern Time) Disposition Final User PLEASE NOTE: All timestamps contained within this report are represented as [place] Standard Time. CONFIDENTIALTY NOTICE: This fax transmission is intended only for the addressee. It contains information that is legally privileged, confidential or otherwise protected from use or disclosure. If you are not the intended recipient, you are strictly prohibited from reviewing, disclosing, copying using or disseminating any of this information or taking any action in reliance on or regarding this information. If you have received this fax in error, please notify us immediately by telephone so that we can arrange for its return to us. Phone: [phone], Toll-Free: [phone], Fax: [phone] Page: 2 of 2 Call Id: [id number] 11/17/2015 7:03:06 PM Go to ED Now Yes Solocinski, RN, Jannetta QuintBeth Caller Understands: Yes Disagree/Comply: Disagree Disagree/Comply Reason: Disagree with instructions Care Advice Given Per Guideline GO TO ED NOW: You need to be seen  in the Emergency  Department. Go to the ER at _________[facility] Hospital. Leave now. Drive carefully. DRIVING: Another adult should drive. CARE ADVICE given per Headache (Adult) guideline. Referrals GO TO FACILITY REFUSED

## 2015-11-19 NOTE — Telephone Encounter (Signed)
I spoke with pt; she is at work today; pt has slight dull h/a and will cb if needed.

## 2015-11-19 NOTE — Telephone Encounter (Signed)
Noted. Thanks.

## 2015-11-26 ENCOUNTER — Telehealth: Payer: Worker's Compensation | Admitting: Family

## 2015-11-26 DIAGNOSIS — N390 Urinary tract infection, site not specified: Secondary | ICD-10-CM

## 2015-11-26 MED ORDER — CIPROFLOXACIN HCL 500 MG PO TABS: 500.0000 mg | ORAL_TABLET | Freq: Two times a day (BID) | ORAL | 0 refills | Status: DC

## 2015-11-26 NOTE — Progress Notes (Signed)

## 2016-01-02 ENCOUNTER — Other Ambulatory Visit: Payer: Self-pay | Admitting: Family Medicine

## 2016-01-25 ENCOUNTER — Telehealth: Payer: Worker's Compensation | Admitting: Family

## 2016-01-25 DIAGNOSIS — J329 Chronic sinusitis, unspecified: Secondary | ICD-10-CM

## 2016-01-25 MED ORDER — AMOXICILLIN-POT CLAVULANATE 875-125 MG PO TABS: 1.0000 | ORAL_TABLET | Freq: Two times a day (BID) | ORAL | 0 refills | Status: DC

## 2016-01-25 NOTE — Progress Notes (Signed)

## 2016-02-19 ENCOUNTER — Telehealth: Payer: Worker's Compensation | Admitting: Family

## 2016-02-19 DIAGNOSIS — J069 Acute upper respiratory infection, unspecified: Secondary | ICD-10-CM

## 2016-02-19 DIAGNOSIS — R059 Cough, unspecified: Secondary | ICD-10-CM

## 2016-02-19 DIAGNOSIS — R05 Cough: Secondary | ICD-10-CM

## 2016-02-19 DIAGNOSIS — B9789 Other viral agents as the cause of diseases classified elsewhere: Secondary | ICD-10-CM

## 2016-02-19 NOTE — Progress Notes (Signed)
We are sorry that you are not feeling well.  Here is how we plan to help!  Based on what you have shared with me it looks like you have upper respiratory tract inflammation that has resulted in a significant cough.  Inflammation and infection in the upper respiratory tract is commonly called bronchitis and has four common causes:  Allergies, Viral Infections, Acid Reflux and Bacterial Infections.  Allergies, viruses and acid reflux are treated by controlling symptoms or eliminating the cause. An example might be a cough caused by taking certain blood pressure medications. You stop the cough by changing the medication. Another example might be a cough caused by acid reflux. Controlling the reflux helps control the cough.  Based on your presentation I believe you most likely have A cough due to a virus.  This is called viral bronchitis and is best treated by rest, plenty of fluids and control of the cough.  You may use Ibuprofen or Tylenol as directed to help your symptoms.     In addition you may use A non-prescription cough medication called Mucinex DM: take 2 tablets every 12 hours.   USE OF BRONCHODILATOR ("RESCUE") INHALERS: There is a risk from using your bronchodilator too frequently.  The risk is that over-reliance on a medication which only relaxes the muscles surrounding the breathing tubes can reduce the effectiveness of medications prescribed to reduce swelling and congestion of the tubes themselves.  Although you feel brief relief from the bronchodilator inhaler, your asthma may actually be worsening with the tubes becoming more swollen and filled with mucus.  This can delay other crucial treatments, such as oral steroid medications. If you need to use a bronchodilator inhaler daily, several times per day, you should discuss this with your provider.  There are probably better treatments that could be used to keep your asthma under control.     HOME CARE . Only take medications as instructed  by your medical team. . Complete the entire course of an antibiotic. . Drink plenty of fluids and get plenty of rest. . Avoid close contacts especially the very young and the elderly . Cover your mouth if you cough or cough into your sleeve. . Always remember to wash your hands . A steam or ultrasonic humidifier can help congestion.   GET HELP RIGHT AWAY IF: . You develop worsening fever. . You become short of breath . You cough up blood. . Your symptoms persist after you have completed your treatment plan MAKE SURE YOU   Understand these instructions.  Will watch your condition.  Will get help right away if you are not doing well or get worse.  Your e-visit answers were reviewed by a board certified advanced clinical practitioner to complete your personal care plan.  Depending on the condition, your plan could have included both over the counter or prescription medications. If there is a problem please reply  once you have received a response from your provider. Your safety is important to us.  If you have drug allergies check your prescription carefully.    You can use MyChart to ask questions about today's visit, request a non-urgent call back, or ask for a work or school excuse for 24 hours related to this e-Visit. If it has been greater than 24 hours you will need to follow up with your provider, or enter a new e-Visit to address those concerns. You will get an e-mail in the next two days asking about your experience.  I hope   that your e-visit has been valuable and will speed your recovery. Thank you for using e-visits.   

## 2016-02-28 ENCOUNTER — Ambulatory Visit (INDEPENDENT_AMBULATORY_CARE_PROVIDER_SITE_OTHER): Payer: BLUE CROSS/BLUE SHIELD | Admitting: Internal Medicine

## 2016-02-28 ENCOUNTER — Encounter: Payer: Self-pay | Admitting: Internal Medicine

## 2016-02-28 VITALS — BP 126/82 | HR 60 | Temp 98.0°F | Wt 198.5 lb

## 2016-02-28 DIAGNOSIS — B9789 Other viral agents as the cause of diseases classified elsewhere: Secondary | ICD-10-CM

## 2016-02-28 DIAGNOSIS — J329 Chronic sinusitis, unspecified: Secondary | ICD-10-CM | POA: Diagnosis not present

## 2016-02-28 NOTE — Patient Instructions (Signed)

## 2016-02-28 NOTE — Progress Notes (Addendum)
HPI  Pt presents to the clinic today with c/o facial pressure, right ear fullness, post nasal drip and sore throat. This started 1-2 weeks ago. Her symptoms are intermittent. She is blowing clear mucous out of her nose. She denies ear pain or decreased hearing. She denies difficulty swallowing. She denies fever, chills or body aches. She did an Evisit 1/16 for the same. They diagnosed her with a viral URI. They advised her to take Mucinex OTC which she has without much improvement. She has also been taking Delsym, Tylenol and Ibuprofen. She has no history of allergies or breathing problems. She has had sick contacts. She can not take flu shots.  Review of Systems     Past Medical History:  Diagnosis Date  . Frequent UTI    in past-with uro w/u and urethral dilation  . Guillain-Barre (HCC) 06/1998   Guillain-Barre Plasmaphoresis- [facility]    Family History  Problem Relation Age of Onset  . Adopted: Yes  . Family history unknown: Yes    Social History   Social History  . Marital status: Married    Spouse name: N/A  . Number of children: N/A  . Years of education: N/A   Occupational History  . Housewife/Teacher [employer]   Social History Main Topics  . Smoking status: Never Smoker  . Smokeless tobacco: Never Used  . Alcohol use No  . Drug use: No  . Sexual activity: Not on file   Other Topics Concern  . Not on file   Social History Narrative   Unknown family history-adopted   Married 1991- lives with husband   2 children   Housewife-teaching at [employer]    Allergies  Allergen Reactions  . Influenza Vaccines     H/o gillian barre syndrome  . Nitrofurantoin     REACTION: ELEVATED LIVER FUNCTIONS  . Sulfonamide Derivatives     REACTION: ITCHING     Constitutional: Denies headache, fatigue, fever or abrupt weight changes.  HEENT:  Positive facial pressure, runny nose, ear fullness and sore throat. Denies eye redness, ear pain,  ringing in the ears, wax buildup, nasal congestion or bloody nose. Respiratory: Denies cough, difficulty breathing or shortness of breath.  Cardiovascular: Denies chest pain, chest tightness, palpitations or swelling in the hands or feet.   No other specific complaints in a complete review of systems (except as listed in HPI above).  Objective:   BP 126/82   Pulse 60   Temp 98 F (36.7 C) (Oral)   Wt 198 lb 8 oz (90 kg)   SpO2 98%   BMI 33.03 kg/m   General: Appears her stated age, well developed, well nourished in NAD. HEENT: Head: normal shape and size, no sinus tenderness noted; Eyes: sclera white, no icterus, conjunctiva pink; Ears: Tm's gray and intact, normal light reflex; Nose: mucosa pink and moist, septum midline; Throat/Mouth: + PND. Teeth present, mucosa erythematous and moist, no exudate noted, no lesions or ulcerations noted.  Neck:  No adenopathy noted.  Cardiovascular: Normal rate and rhythm.  Pulmonary/Chest: Normal effort and positive vesicular breath sounds. No respiratory distress. No wheezes, rales or ronchi noted.       Assessment & Plan:   Viral sinusitis  Can use a Neti Pot which can be purchased from your local drug store. Flonase 2 sprays each nostril for 3 days and then as needed. Start Zyrtec OTC 80 mg Depo IM today  RTC as needed or if symptoms persist.  Webb Silversmith, NP

## 2016-02-29 ENCOUNTER — Ambulatory Visit: Payer: Self-pay | Admitting: Family Medicine

## 2016-03-25 ENCOUNTER — Telehealth: Payer: Worker's Compensation | Admitting: Family

## 2016-03-25 DIAGNOSIS — N3000 Acute cystitis without hematuria: Secondary | ICD-10-CM

## 2016-03-25 MED ORDER — CIPROFLOXACIN HCL 500 MG PO TABS: 500.0000 mg | ORAL_TABLET | Freq: Two times a day (BID) | ORAL | 0 refills | Status: DC

## 2016-03-25 NOTE — Progress Notes (Signed)

## 2016-03-28 ENCOUNTER — Ambulatory Visit: Payer: Self-pay | Admitting: Family Medicine

## 2016-03-31 ENCOUNTER — Ambulatory Visit (INDEPENDENT_AMBULATORY_CARE_PROVIDER_SITE_OTHER): Payer: BLUE CROSS/BLUE SHIELD | Admitting: Family Medicine

## 2016-03-31 ENCOUNTER — Encounter: Payer: Self-pay | Admitting: Family Medicine

## 2016-03-31 DIAGNOSIS — G43009 Migraine without aura, not intractable, without status migrainosus: Secondary | ICD-10-CM | POA: Diagnosis not present

## 2016-03-31 MED ORDER — AMITRIPTYLINE HCL 10 MG PO TABS: 10.0000 mg | ORAL_TABLET | Freq: Every day | ORAL | 1 refills | Status: DC

## 2016-03-31 MED ORDER — SUMATRIPTAN SUCCINATE 100 MG PO TABS: ORAL_TABLET | ORAL | 1 refills | Status: DC

## 2016-03-31 NOTE — Progress Notes (Signed)
Pre visit review using our clinic review tool, if applicable. No additional management support is needed unless otherwise documented below in the visit note. 

## 2016-03-31 NOTE — Progress Notes (Signed)
Migraine f/u.  Last summer she saw Dr. Ermalene SearingBedsole.  It got more frequent last summer.  She thought her HA were worse around the time of her menses.  Now she has them between menses, with the HA lasting for about 2 days.  She'll have eye watering with the HA.  HA pain is pressure behind the eyes.  Photophobia.  Some nausea with HA.  Laying down in dark room helps.    She was adopted and doesn't know all of her family history.    Imitrex prev helped some, used PRN.  Tried caffeine and ibuprofen prev, prn, with limited effect.    Typical day- 2 cups of coffee, 1 mtn dew, then possibly another soda later in the day.    In a month, she'll have about ~2 bad headaches, each lasting ~2 days with ~1 smaller HA per week.   No aura.    PMH and SH reviewed  ROS: Per HPI unless specifically indicated in ROS section   Meds, vitals, and allergies reviewed.   GEN: nad, alert and oriented HEENT: mucous membranes moist NECK: supple w/o LA CV: rrr.  no murmur PULM: ctab, no inc wob ABD: soft, +bs EXT: no edema SKIN: no acute rash CN 2-12 wnl B, S/S/DTR wnl x4 .

## 2016-03-31 NOTE — Patient Instructions (Signed)
Start amitriptyline 10mg  at night.  Okay to go up to 20mg  at night if needed after about 2 weeks, then 30mg  if needed.  Use imitrex if needed.  Update me in a few weeks.  Try to limit caffeine and splenda, but don't quit cold [place].  Take care.  Glad to see you.

## 2016-04-01 NOTE — Assessment & Plan Note (Signed)
>  25 minutes spent in face to face time with patient, >50% spent in counselling or coordination of care Discussed with patient about migraine pathophysiology. Try to limit caffeine. Continue work on weight loss. Avoid Splenda, as this may contribute to headaches. Reasonable to start amitriptyline 10 mg at night. If tolerated, but not effective, then can increase to 20 mg after few weeks, then 30 mg after a few weeks area continue Imitrex as needed for headaches. All discussed with patient. Routine cautions given on amitriptyline. Rationale for prophylactic medication discussed with patient and she understood.

## 2016-05-13 ENCOUNTER — Other Ambulatory Visit: Payer: Self-pay | Admitting: Family Medicine

## 2016-05-13 NOTE — Telephone Encounter (Signed)
Electronic refill request. Last Filled:    10 tablet 1 03/31/2016  Last office visit:   03/31/16  Please advise.

## 2016-05-14 ENCOUNTER — Telehealth: Payer: Self-pay

## 2016-05-14 ENCOUNTER — Ambulatory Visit: Payer: Self-pay | Admitting: Obstetrics and Gynecology

## 2016-05-14 NOTE — Telephone Encounter (Signed)
Left message on voicemail for patient to call back. 

## 2016-05-14 NOTE — Telephone Encounter (Signed)
Patient returned Sherry Mendoza's call.  Patient can be reached at [phone].

## 2016-05-14 NOTE — Telephone Encounter (Signed)
Spoke to patient and was advised that she is doing much better since adding the amitriptyline. Patient stated that yesterday was the first headache that she has had in about 6 weeks. Patient stated that she will let Dr. Para March know if anything changes.

## 2016-05-14 NOTE — Telephone Encounter (Signed)
PA has been submitted via covermymeds and I requested urgent review---awaiting response

## 2016-05-14 NOTE — Telephone Encounter (Signed)
The PA came back stating that [employer] was the incorrect 3rd party processor... I called [facility] and they stated there was nothing needed from PCP office as they are processing Rx and it will cost patient a little over $9....  I called pt, no answer, lmovm

## 2016-05-14 NOTE — Telephone Encounter (Signed)
PLEASE NOTE: All timestamps contained within this report are represented as [place] Standard Time. CONFIDENTIALTY NOTICE: This fax transmission is intended only for the addressee. It contains information that is legally privileged, confidential or otherwise protected from use or disclosure. If you are not the intended recipient, you are strictly prohibited from reviewing, disclosing, copying using or disseminating any of this information or taking any action in reliance on or regarding this information. If you have received this fax in error, please notify us immediately by telephone so that we can arrange for its return to [place]. Phone: [phone], Toll-Free: [phone], Fax: [phone] Page: 1 of 1 Call Id: [id number] [facility] Day - Client TELEPHONE ADVICE RECORD [facility] Patient Name: Sherry Mendoza Gender: Female DOB: 1966/01/31 Age: 51 Y 10 M 29 D Return Phone Number: [phone] (Primary), [phone] (Secondary) City/State/Zip: [place] Client [facility] Day - Client Client Site [facility] - Day Physician Raechel Ache - MD Who Is Calling Patient / Member / Family / Caregiver Call Type Triage / Clinical Caller Name Mr. Labate Relationship To Patient Spouse Return Phone Number [phone] (Primary) Chief Complaint Headache Reason for Call Symptomatic / Request for Health Information Initial Comment Caller states wife is having a really bad migraine, Dr prescribed Imitrix but she is out. Additional Comment Pharmacy closes at 10 PM Appointment Disposition EMR Appointment Not Necessary Info pasted into Epic No Nurse Assessment Nurse: Constance Goltz, RN, Shanda Bumps Date/Time Lamount Cohen Time): 05/13/2016 10:41:23 PM Confirm and document reason for call. If symptomatic, describe symptoms. ---Caller states that patient called earlier this afternoon when she realized she was out of imitrix. Pharmacy stated that  the medication needed a prior authorization from the office, the pharmacist stated that patient could try excedrin migraine. Wife is currently sleeping after taking the excedrin. Caller is asking if a prior authorization can be sent in. Please document clinical information provided and list any resource used. ---Notified caller that we do not handle prior authorizations after hours but that I would send a note to the office tomorrow making them aware that one was needed for the medication. Guidelines Guideline Title Affirmed Question Disp. Time Lamount Cohen Time) Disposition Final User 05/13/2016 10:48:42 PM Clinical Call Yes Constance Goltz, RN, Shanda Bumps

## 2016-05-14 NOTE — Telephone Encounter (Signed)
Please try to get PA for patient.

## 2016-05-14 NOTE — Telephone Encounter (Signed)
Sent. Please get update from patient. Is she doing any better with addition of amitriptyline? Let me know. Thanks.

## 2016-06-06 ENCOUNTER — Ambulatory Visit: Payer: Self-pay | Admitting: Obstetrics and Gynecology

## 2016-06-23 ENCOUNTER — Ambulatory Visit (INDEPENDENT_AMBULATORY_CARE_PROVIDER_SITE_OTHER): Payer: BLUE CROSS/BLUE SHIELD | Admitting: Obstetrics and Gynecology

## 2016-06-23 ENCOUNTER — Emergency Department: Payer: BLUE CROSS/BLUE SHIELD

## 2016-06-23 ENCOUNTER — Encounter: Payer: Self-pay | Admitting: Emergency Medicine

## 2016-06-23 ENCOUNTER — Encounter: Payer: Self-pay | Admitting: Obstetrics and Gynecology

## 2016-06-23 ENCOUNTER — Telehealth: Payer: Self-pay

## 2016-06-23 ENCOUNTER — Emergency Department
Admission: EM | Admit: 2016-06-23 | Discharge: 2016-06-23 | Disposition: A | Discharge status: Home / Self Care | Payer: BLUE CROSS/BLUE SHIELD | Attending: Emergency Medicine | Admitting: Emergency Medicine

## 2016-06-23 DIAGNOSIS — Z1231 Encounter for screening mammogram for malignant neoplasm of breast: Secondary | ICD-10-CM | POA: Diagnosis not present

## 2016-06-23 DIAGNOSIS — Z124 Encounter for screening for malignant neoplasm of cervix: Secondary | ICD-10-CM

## 2016-06-23 DIAGNOSIS — R1011 Right upper quadrant pain: Secondary | ICD-10-CM | POA: Insufficient documentation

## 2016-06-23 DIAGNOSIS — Z01411 Encounter for gynecological examination (general) (routine) with abnormal findings: Secondary | ICD-10-CM

## 2016-06-23 DIAGNOSIS — Z1239 Encounter for other screening for malignant neoplasm of breast: Secondary | ICD-10-CM

## 2016-06-23 DIAGNOSIS — M545 Low back pain, unspecified: Secondary | ICD-10-CM

## 2016-06-23 DIAGNOSIS — R87613 High grade squamous intraepithelial lesion on cytologic smear of cervix (HGSIL): Secondary | ICD-10-CM | POA: Diagnosis not present

## 2016-06-23 LAB — URINALYSIS, COMPLETE (UACMP) WITH MICROSCOPIC
Bacteria, UA: NONE SEEN
Bilirubin Urine: NEGATIVE
Glucose, UA: NEGATIVE mg/dL
Hgb urine dipstick: NEGATIVE
Ketones, ur: 80 mg/dL — AB
Leukocytes, UA: NEGATIVE
Nitrite: NEGATIVE
Protein, ur: NEGATIVE mg/dL
Specific Gravity, Urine: 1.017 (ref 1.005–1.030)
pH: 6 (ref 5.0–8.0)

## 2016-06-23 LAB — COMPREHENSIVE METABOLIC PANEL
ALT: 25 U/L (ref 14–54)
AST: 28 U/L (ref 15–41)
Albumin: 4.4 g/dL (ref 3.5–5.0)
Alkaline Phosphatase: 57 U/L (ref 38–126)
Anion gap: 10 (ref 5–15)
BUN: 10 mg/dL (ref 6–20)
CO2: 24 mmol/L (ref 22–32)
Calcium: 9.4 mg/dL (ref 8.9–10.3)
Chloride: 106 mmol/L (ref 101–111)
Creatinine, Ser: 0.67 mg/dL (ref 0.44–1.00)
GFR calc Af Amer: >60 mL/min (ref >60)
GFR calc non Af Amer: >60 mL/min (ref >60)
Glucose, Bld: 80 mg/dL (ref 65–99)
Potassium: 3.5 mmol/L (ref 3.5–5.1)
Sodium: 140 mmol/L (ref 135–145)
Total Bilirubin: 0.5 mg/dL (ref 0.3–1.2)
Total Protein: 7.4 g/dL (ref 6.5–8.1)

## 2016-06-23 LAB — CBC
HCT: 43.4 % (ref 35.0–47.0)
Hemoglobin: 14.6 g/dL (ref 12.0–16.0)
MCH: 29.1 pg (ref 26.0–34.0)
MCHC: 33.5 g/dL (ref 32.0–36.0)
MCV: 87 fL (ref 80.0–100.0)
Platelets: 240 10*3/uL (ref 150–440)
RBC: 5 MIL/uL (ref 3.80–5.20)
RDW: 12.6 % (ref 11.5–14.5)
WBC: 6 10*3/uL (ref 3.6–11.0)

## 2016-06-23 LAB — LIPASE, BLOOD: Lipase: 26 U/L (ref 11–51)

## 2016-06-23 MED ORDER — FAMOTIDINE 20 MG PO TABS: 20.0000 mg | ORAL_TABLET | Freq: Two times a day (BID) | ORAL | 0 refills | Status: DC

## 2016-06-23 MED ORDER — ONDANSETRON 4 MG PO TBDP: 4.0000 mg | ORAL_TABLET | Freq: Three times a day (TID) | ORAL | 0 refills | Status: DC | PRN

## 2016-06-23 NOTE — Telephone Encounter (Signed)
Patient went to ER, has had labs drawn and is awaiting results.

## 2016-06-23 NOTE — ED Provider Notes (Signed)
[facility] Emergency Department Provider Note  ____________________________________________  Time seen: Approximately 5:54 PM  I have reviewed the triage vital signs and the nursing notes.   HISTORY  Chief Complaint Abdominal Pain    HPI LASHANTE FRYBERGER is a 51 y.o. female who complains of right upper quadrant abdominal pain for the last 2 days, worse with eating. Had a fever of 100.6 last night. Was seen for routine visit at her gynecologist today and there is recommended she come to the ED for evaluation of possible cholecystitis.She reports that today her actually her pain is much better and her fever seems to resolve. She is not as nauseated. When present in the past pain radiates around to the back, it is sharp and severe. Again worse with eating, no alleviating factors.  No diarrhea or light-colored stool. No vomiting.     Past Medical History:  Diagnosis Date  . Frequent UTI    in past-with uro w/u and urethral dilation  . Guillain-Barre (HCC) 06/1998   Guillain-Barre Plasmaphoresis- hospitalized x 1 week at [facility]     Patient Active Problem List   Diagnosis Date Noted  . Common migraine without aura 07/20/2015  . Influenza-like illness 04/03/2015  . ASCUS with positive high risk HPV 05/18/2014  . Routine general medical examination at a health care facility 05/12/2014  . Advance care planning 05/12/2014  . Rash and nonspecific skin eruption 05/12/2014  . Borderline hypothyroidism 04/10/2010  . SHOULDER PAIN, RIGHT 01/16/2009     Past Surgical History:  Procedure Laterality Date  . TONSILLECTOMY  1985     Prior to Admission medications   Medication Sig Start Date End Date Taking? Authorizing Provider  amitriptyline (ELAVIL) 10 MG tablet Take 1-3 tablets (10-30 mg total) by mouth at bedtime. 03/31/16   Joaquim Nam, MD  famotidine (PEPCID) 20 MG tablet Take 1 tablet (20 mg total) by mouth 2 (two) times daily. 06/23/16   Sharman Cheek, MD  ondansetron (ZOFRAN ODT) 4 MG disintegrating tablet Take 1 tablet (4 mg total) by mouth every 8 (eight) hours as needed for nausea or vomiting. 06/23/16   Sharman Cheek, MD  SUMAtriptan (IMITREX) 100 MG tablet TAKE 1 TABLET BY MOUTH AT ONSET OF HEADACHE. MAY REPEAT IN 2 HOURS IF NEEDED 05/14/16   Joaquim Nam, MD     Allergies Influenza vaccines; Nitrofurantoin; and Sulfonamide derivatives   Family History  Problem Relation Age of Onset  . Adopted: Yes  . Family history unknown: Yes    Social History Social History  Substance Use Topics  . Smoking status: Never Smoker  . Smokeless tobacco: Never Used  . Alcohol use No    Review of Systems  Constitutional:   Positive fever without chills.  ENT:   No sore throat. No rhinorrhea. Cardiovascular:   No chest pain or syncope. Respiratory:   No dyspnea or cough. Gastrointestinal: Positive abdominal pain as above without vomiting or diarrhea.  Musculoskeletal:   Negative for focal pain or swelling All other systems reviewed and are negative except as documented above in ROS and HPI.  ____________________________________________   PHYSICAL EXAM:  VITAL SIGNS: ED Triage Vitals  Enc Vitals Group     BP 06/23/16 1319 139/86     Pulse Rate 06/23/16 1319 71     Resp 06/23/16 1319 19     Temp 06/23/16 1319 98.6 F (37 C)     Temp Source 06/23/16 1319 Oral     SpO2 06/23/16 1319  100 %     Weight 06/23/16 1320 202 lb (91.6 kg)     Height 06/23/16 1320 5\' 5"  (1.651 m)     Head Circumference --      Peak Flow --      Pain Score --      Pain Loc --      Pain Edu? --      Excl. in GC? --     Vital signs reviewed, nursing assessments reviewed.   Constitutional:   Alert and oriented. Well appearing and in no distress. Eyes:   No scleral icterus. No conjunctival pallor. PERRL. EOMI.  No nystagmus. ENT   Head:   Normocephalic and atraumatic.   Nose:   No congestion/rhinnorhea.    Mouth/Throat:    MMM, no pharyngeal erythema. No peritonsillar mass.    Neck:   No meningismus. Full ROM Hematological/Lymphatic/Immunilogical:   No cervical lymphadenopathy. Cardiovascular:   RRR. Symmetric bilateral radial and DP pulses.  No murmurs.  Respiratory:   Normal respiratory effort without tachypnea/retractions. Breath sounds are clear and equal bilaterally. No wheezes/rales/rhonchi. Gastrointestinal:   Soft With right upper quadrant tenderness. Negative Murphy sign. Non distended. There is no CVA tenderness.  No rebound, rigidity, or guarding. Genitourinary:   deferred Musculoskeletal:   Normal range of motion in all extremities. No joint effusions.  No lower extremity tenderness.  No edema. Neurologic:   Normal speech and language.  CN 2-10 normal. Motor grossly intact. No gross focal neurologic deficits are appreciated.  Skin:    Skin is warm, dry and intact. No rash noted.  No petechiae, purpura, or bullae.  ____________________________________________    LABS (pertinent positives/negatives) (all labs ordered are listed, but only abnormal results are displayed) Labs Reviewed  URINALYSIS, COMPLETE (UACMP) WITH MICROSCOPIC - Abnormal; Notable for the following:       Result Value   Color, Urine YELLOW (*)    APPearance CLEAR (*)    Ketones, ur 80 (*)    Squamous Epithelial / LPF 0-5 (*)    All other components within normal limits  LIPASE, BLOOD  COMPREHENSIVE METABOLIC PANEL  CBC   ____________________________________________   EKG    ____________________________________________    RADIOLOGY  US Abdomen Limited Ruq  Result Date: 06/23/2016 CLINICAL DATA:  Right upper quadrant pain worse with eating EXAM: US ABDOMEN LIMITED - RIGHT UPPER QUADRANT COMPARISON:  None. FINDINGS: Gallbladder: No gallstones or wall thickening visualized. No sonographic Murphy sign noted by sonographer. Common bile duct: Diameter: 5.2 mm Liver: No focal lesion identified. Within normal limits  in parenchymal echogenicity. IMPRESSION: Negative right upper quadrant abdominal ultrasound Electronically Signed   By: Jasmine Pang M.D.   On: 06/23/2016 16:33    ____________________________________________   PROCEDURES Procedures  ____________________________________________   INITIAL IMPRESSION / ASSESSMENT AND PLAN / ED COURSE  Pertinent labs & imaging results that were available during my care of the patient were reviewed by me and considered in my medical decision making (see chart for details).  Patient presents with right upper quadrant tenderness, worsened by eating. It is better today but overall had a severe episode over the last 2 days, with a low-grade fever at home. Ultrasound performed to evaluate for cholecystitis, but showed no signs of cholecystitis. No gallstones. Labs are completely normal. Given the nature of her symptoms, she may have had a single small gallstone that passed spontaneously, versus peptic ulcer disease or other gastritis. She is now feeling better, abdominal exam is improved, tolerating oral intake.  No fever in the ED today. No white blood cell count. LFTs normal. Discharge home to follow up with primary care. Return precautions given. Advised of possibility for acalculous cholecystitis and his symptoms worsen to return to emergency department.   Considering the patient's symptoms, medical history, and physical examination today, I have low suspicion for cholecystitis or biliary pathology, pancreatitis, perforation or bowel obstruction, hernia, intra-abdominal abscess, AAA or dissection, volvulus or intussusception, mesenteric ischemia, or appendicitis.  Pepcid and Zofran for symptoms.  ____________________________________________   FINAL CLINICAL IMPRESSION(S) / ED DIAGNOSES  Final diagnoses:  Right upper quadrant abdominal pain      New Prescriptions   FAMOTIDINE (PEPCID) 20 MG TABLET    Take 1 tablet (20 mg total) by mouth 2 (two) times  daily.   ONDANSETRON (ZOFRAN ODT) 4 MG DISINTEGRATING TABLET    Take 1 tablet (4 mg total) by mouth every 8 (eight) hours as needed for nausea or vomiting.     Portions of this note were generated with dragon dictation software. Dictation errors may occur despite best attempts at proofreading.    Sharman CheekStafford, Gean Laursen, MD 06/23/16 [phone]

## 2016-06-23 NOTE — ED Triage Notes (Signed)
Pt presents to ED c/o abdominal pain and fever(100.6) last evening and was advised to come to ED due to questionable symptoms of cholecystitis. Pt with nausea , RUQ tenderness.

## 2016-06-23 NOTE — Patient Instructions (Signed)
Cholecystitis °Cholecystitis is inflammation of the gallbladder. It is often called a gallbladder attack. The gallbladder is a pear-shaped organ that lies beneath the liver on the right side of the body. The gallbladder stores bile, which is a fluid that helps the body to digest fats. If bile builds up in your gallbladder, your gallbladder becomes inflamed. This condition may occur suddenly (be acute). Repeat episodes of acute cholecystitis or prolonged episodes may lead to a long-term (chronic) condition. Cholecystitis is serious and it requires treatment. °What are the causes? °The most common cause of this condition is gallstones. Gallstones can block the tube (duct) that carries bile out of your gallbladder. This causes bile to build up. Other causes of this condition include: °· Damage to the gallbladder due to a decrease in blood flow. °· Infections in the bile ducts. °· Scars or kinks in the bile ducts. °· Tumors in the liver, pancreas, or gallbladder. °What increases the risk? °This condition is more likely to develop in: °· People who have sickle cell disease. °· People who take birth control pills or use estrogen. °· People who have alcoholic liver disease. °· People who have liver cirrhosis. °· People who have their nutrition delivered through a vein (parenteral nutrition). °· People who do not eat or drink (do fasting) for a long period of time. °· People who are obese. °· People who have rapid weight loss. °· People who are pregnant. °· People who have increased triglyceride levels. °· People who have pancreatitis. °What are the signs or symptoms? °Symptoms of this condition include: °· Abdominal pain, especially in the upper right area of the abdomen. °· Abdominal tenderness or bloating. °· Nausea. °· Vomiting. °· Fever. °· Chills. °· Yellowing of the skin and the whites of the eyes (jaundice). °How is this diagnosed? °This condition is diagnosed with a medical history and physical exam. You may also  have other tests, including: °· Imaging tests, such as: °¨ An ultrasound of the gallbladder. °¨ A CT scan of the abdomen. °¨ A gallbladder nuclear scan (HIDA scan). This scan allows your health care provider to see the bile moving from your liver to your gallbladder and to your small intestine. °¨ MRI. °· Blood tests, such as: °¨ A complete blood count, because the white blood cell count may be higher than normal. °¨ Liver function tests, because some levels may be higher than normal with certain types of gallstones. °How is this treated? °Treatment may include: °· Fasting for a certain amount of time. °· IV fluids. °· Medicine to treat pain or vomiting. °· Antibiotic medicine. °· Surgery to remove your gallbladder (cholecystectomy). This may happen immediately or at a later time. °Follow these instructions at home: °Home care will depend on your treatment. In general: °· Take over-the-counter and prescription medicines only as told by your health care provider. °· If you were prescribed an antibiotic medicine, take it as told by your health care provider. Do not stop taking the antibiotic even if you start to feel better. °· Follow instructions from your health care provider about what to eat or drink. When you are allowed to eat, avoid eating or drinking anything that triggers your symptoms. °· Keep all follow-up visits as told by your health care provider. This is important. °Contact a health care provider if: °· Your pain is not controlled with medicine. °· You have a fever. °Get help right away if: °· Your pain moves to another part of your abdomen or to your   back. °· You continue to have symptoms or you develop new symptoms even with treatment. °This information is not intended to replace advice given to you by your health care provider. Make sure you discuss any questions you have with your health care provider. °Document Released: 01/20/2005 Document Revised: 05/31/2015 Document Reviewed:  05/03/2014 °Elsevier Interactive Patient Education © 2017 [employer] Inc. ° °

## 2016-06-23 NOTE — Telephone Encounter (Signed)
Pt was seen by GYN this morning with temp of 99 something; last night fever was 100.6; pt having pain upper abdomen; worse on rt side and mid back pain. GYN suggested pt should go to ED for eval of GB and possible IV fluids with abx. Pain level now is 3. No vomiting but nauseated.Pt wants Dr Lianne Bushyuncan's opinion before going to ED. Pt request cb

## 2016-06-23 NOTE — ED Notes (Signed)
Patient transported to US at this time.  

## 2016-06-23 NOTE — Telephone Encounter (Signed)
If the other MD suggested ER eval, then I can't dispute that.  I'm glad she is doing some better, but yes she should likely get eval done.  Thanks.

## 2016-06-23 NOTE — Discharge Instructions (Signed)
Your blood tests and ultrasound today of the gallbladder were unremarkable.  Follow up with your doctor for continued monitoring of your symptoms.

## 2016-06-23 NOTE — Progress Notes (Signed)
Patient ID: Sherry Mendoza, female   DOB: [birth year], 51 y.o.   MRN: [id number]     Gynecology Annual Exam  PCP: Joaquim Namuncan, Graham S, MD  Chief Complaint:  Chief Complaint  Patient presents with  . Gynecologic Exam    Stomach pain/low grade fever/hurting in back    History of Present Illness:Patient is a 51 y.o. G2P2002 presents for annual exam. The patient has no complaints today.   LMP: Patient's last menstrual period was 06/08/2016. Average Interval: irregular, 30-45  days Duration of flow: 5 days Heavy Menses: no Clots: no Intermenstrual Bleeding: no Postcoital Bleeding: no Dysmenorrhea: no   The patient is sexually active. She denies dyspareunia.  The patient does perform self breast exams.  There is no notable family history of breast or ovarian cancer in her family.  The patient wears seatbelts: yes.   The patient has regular exercise: no.    The patient denies current symptoms of depression.    Has had postprandial nausea, abdominal pain, radiating into back, loose stools, and fevers spiking at 100.6.  Still has gallbaldder.   Review of Systems: Review of Systems  Constitutional: Positive for fever. Negative for chills.  HENT: Negative for congestion.   Respiratory: Negative for cough and shortness of breath.   Cardiovascular: Negative for chest pain and palpitations.  Gastrointestinal: Positive for abdominal pain, diarrhea and nausea. Negative for constipation, heartburn and vomiting.  Genitourinary: Negative for dysuria, frequency and urgency.  Skin: Negative for itching and rash.  Neurological: Negative for dizziness and headaches.  Endo/Heme/Allergies: Negative for polydipsia.  Psychiatric/Behavioral: Negative for depression.    Past Medical History:  Past Medical History:  Diagnosis Date  . Frequent UTI    in past-with uro w/u and urethral dilation  . Guillain-Barre (HCC) 06/1998   Guillain-Barre Plasmaphoresis- [facility]    Past  Surgical History:  Past Surgical History:  Procedure Laterality Date  . TONSILLECTOMY  1985    Gynecologic History:  Patient's last menstrual period was 06/08/2016. Last Pap: Results were: 06/21/2015 HSIL HPV positive with negative colposcopy Last mammogram:06/01/2014 Results were: BI-RAD I Obstetric History: [id number]  Family History:  Family History  Problem Relation Age of Onset  . Adopted: Yes  . Family history unknown: Yes    Social History:  Social History   Social History  . Marital status: Married    Spouse name: N/A  . Number of children: N/A  . Years of education: N/A   Occupational History  . Housewife/Teacher [employer]   Social History Main Topics  . Smoking status: Never Smoker  . Smokeless tobacco: Never Used  . Alcohol use No  . Drug use: No  . Sexual activity: Yes    Birth control/ protection: None   Other Topics Concern  . Not on file   Social History Narrative   Unknown family history-adopted   Married 1991- lives with husband   2 children   Housewife-teaching at [employer]    Allergies:  Allergies  Allergen Reactions  . Influenza Vaccines     H/o gillian barre syndrome  . Nitrofurantoin     REACTION: ELEVATED LIVER FUNCTIONS  . Sulfonamide Derivatives     REACTION: ITCHING    Medications: Prior to Admission medications   Medication Sig Start Date End Date Taking? Authorizing Provider  amitriptyline (ELAVIL) 10 MG tablet Take 1-3 tablets (10-30 mg total) by mouth at bedtime. 03/31/16  Yes Joaquim Namuncan, Graham S, MD  SUMAtriptan (IMITREX) 100 MG  tablet TAKE 1 TABLET BY MOUTH AT ONSET OF HEADACHE. MAY REPEAT IN 2 HOURS IF NEEDED 05/14/16  Yes Joaquim Nam, MD    Physical Exam Vitals: Blood pressure 124/88, pulse 75, temperature 99.3 F (37.4 C), height 5\' 5"  (1.651 m), weight 202 lb (91.6 kg), last menstrual period 06/08/2016.  General: NAD HEENT: normocephalic, anicteric Thyroid: no enlargement, no palpable  nodules Pulmonary: No increased work of breathing, CTAB Cardiovascular: RRR, distal pulses 2+ Breast: Breast symmetrical, no tenderness, no palpable nodules or masses, no skin or nipple retraction present, no nipple discharge.  No axillary or supraclavicular lymphadenopathy. Abdomen: NABS, soft, RUQ tenderness, positive murphy's sign, non-distended.  Umbilicus without lesions.  No hepatomegaly, splenomegaly or masses palpable. No evidence of hernia  Genitourinary:  External: Normal external female genitalia.  Normal urethral meatus, normal  Bartholin's and Skene's glands.    Vagina: Normal vaginal mucosa, no evidence of prolapse.    Cervix: Grossly normal in appearance, no bleeding  Uterus: Non-enlarged, mobile, normal contour.  No CMT  Adnexa: ovaries non-enlarged, no adnexal masses  Rectal: deferred  Lymphatic: no evidence of inguinal lymphadenopathy Extremities: no edema, erythema, or tenderness Neurologic: Grossly intact Psychiatric: mood appropriate, affect full  Female chaperone present for pelvic and breast  portions of the physical exam    Assessment: 51 y.o. [id number] annual exam, possible cholecystitis  Plan: Problem List Items Addressed This Visit    None    Visit Diagnoses    Screening for malignant neoplasm of cervix       Relevant Orders   PapIG, HPV, rfx 16/18   Breast screening       Relevant Orders   MM DIGITAL SCREENING BILATERAL   Encounter for gynecological examination with abnormal finding       Relevant Orders   PapIG, HPV, rfx 16/18   Acute low back pain without sciatica, unspecified back pain laterality          1) Mammogram - recommend yearly screening mammogram.  Mammogram Was ordered today  2) Cholecystitis in differential for abdominal pain, discussed proceeding to ED for evalation  3) ASCCP guidelines and rational discussed.  Patient opts for yearly screening interval  4) Osteoporosis  - per USPTF routine screening DEXA at age 72  5)  Routine healthcare maintenance including cholesterol, diabetes screening discussed managed by PCP  6) Colonoscopy  - needs declines at present  7) Follow up 1 year for routine annual

## 2016-06-23 NOTE — ED Notes (Signed)
Pt provided with diet ginger ale at this time. Pt states that her stomach still hurts a little, but that she does not feel nauseated at this time.

## 2016-06-23 NOTE — Telephone Encounter (Signed)
Noted. Thanks.

## 2016-06-25 LAB — PAPIG, HPV, RFX 16/18
HPV, high-risk: POSITIVE — AB
PAP Smear Comment: 0

## 2016-06-26 ENCOUNTER — Telehealth: Payer: Self-pay

## 2016-06-26 NOTE — Telephone Encounter (Signed)
[facility] calling to confirm we recv'ed abnl pap she faxed over this am.  Left detailed msg that we did receive it.

## 2016-06-29 ENCOUNTER — Telehealth: Payer: Worker's Compensation | Admitting: Family

## 2016-06-29 DIAGNOSIS — N390 Urinary tract infection, site not specified: Secondary | ICD-10-CM

## 2016-06-29 MED ORDER — CEPHALEXIN 500 MG PO CAPS: 500.0000 mg | ORAL_CAPSULE | Freq: Two times a day (BID) | ORAL | 0 refills | Status: DC

## 2016-06-29 NOTE — Progress Notes (Signed)
Thank you for the details you put in the comment boxes. Those details really help us take better care of you. We used to prescribe Cipro but the national guidelines have changed. Keflex is more effective and Cipro would be a lesser choice if you had an allergy to Keflex.  We are sorry that you are not feeling well.  Here is how we plan to help!  Based on what you shared with me it looks like you most likely have a simple urinary tract infection.  A UTI (Urinary Tract Infection) is a bacterial infection of the bladder.  Most cases of urinary tract infections are simple to treat but a key part of your care is to encourage you to drink plenty of fluids and watch your symptoms carefully.  I have prescribed Keflex 500 mg twice a day for 7 days.  Your symptoms should gradually improve. Call us if the burning in your urine worsens, you develop worsening fever, back pain or pelvic pain or if your symptoms do not resolve after completing the antibiotic.  Urinary tract infections can be prevented by drinking plenty of water to keep your body hydrated.  Also be sure when you wipe, wipe from front to back and don't hold it in!  If possible, empty your bladder every 4 hours.  Your e-visit answers were reviewed by a board certified advanced clinical practitioner to complete your personal care plan.  Depending on the condition, your plan could have included both over the counter or prescription medications.  If there is a problem please reply  once you have received a response from your provider.  Your safety is important to us.  If you have drug allergies check your prescription carefully.    You can use MyChart to ask questions about today's visit, request a non-urgent call back, or ask for a work or school excuse for 24 hours related to this e-Visit. If it has been greater than 24 hours you will need to follow up with your provider, or enter a new e-Visit to address those concerns.   You will get an  e-mail in the next two days asking about your experience.  I hope that your e-visit has been valuable and will speed your recovery. Thank you for using e-visits.

## 2016-07-01 ENCOUNTER — Telehealth: Payer: Self-pay | Admitting: Obstetrics and Gynecology

## 2016-07-01 ENCOUNTER — Encounter: Payer: Self-pay | Admitting: Obstetrics and Gynecology

## 2016-07-01 NOTE — Telephone Encounter (Signed)
Pt is schedule 07/31/16 for Colpo with AMS

## 2016-07-01 NOTE — Telephone Encounter (Signed)
-----   Message from Vena AustriaAndreas Staebler, MD sent at 07/01/2016  1:43 PM EDT ----- Regarding: colposcopy Patient needs colposcopy, aware of results

## 2016-07-07 ENCOUNTER — Other Ambulatory Visit: Payer: Self-pay | Admitting: *Deleted

## 2016-07-07 MED ORDER — AMITRIPTYLINE HCL 10 MG PO TABS: 10.0000 mg | ORAL_TABLET | Freq: Every day | ORAL | 2 refills | Status: DC

## 2016-07-07 NOTE — Telephone Encounter (Signed)
Due for CPE.  Thanks. Sent.  

## 2016-07-07 NOTE — Telephone Encounter (Signed)
Faxed refill request. Last office visit:   04/02/16 No recent CPE. Last Filled:    90 tablet 1 03/31/2016  Please advise.

## 2016-07-08 NOTE — Telephone Encounter (Signed)
Left detailed message on voicemail.  

## 2016-07-27 ENCOUNTER — Telehealth: Payer: Worker's Compensation | Admitting: Family

## 2016-07-27 DIAGNOSIS — R399 Unspecified symptoms and signs involving the genitourinary system: Secondary | ICD-10-CM

## 2016-07-27 MED ORDER — CIPROFLOXACIN HCL 500 MG PO TABS: 500.0000 mg | ORAL_TABLET | Freq: Two times a day (BID) | ORAL | 0 refills | Status: DC

## 2016-07-27 NOTE — Progress Notes (Signed)

## 2016-07-30 ENCOUNTER — Other Ambulatory Visit: Payer: Self-pay | Admitting: Family Medicine

## 2016-07-31 ENCOUNTER — Ambulatory Visit: Payer: BLUE CROSS/BLUE SHIELD | Admitting: Obstetrics and Gynecology

## 2016-08-12 ENCOUNTER — Other Ambulatory Visit: Payer: Self-pay | Admitting: *Deleted

## 2016-09-17 DIAGNOSIS — H524 Presbyopia: Secondary | ICD-10-CM | POA: Diagnosis not present

## 2016-10-19 ENCOUNTER — Other Ambulatory Visit: Payer: Self-pay | Admitting: Family Medicine

## 2016-10-20 NOTE — Telephone Encounter (Signed)
Electronic refill request. Amitriptyline Last office visit:   03/27/16 Last Filled:    90 tablet 2 07/07/2016  Please advise.

## 2016-10-21 NOTE — Telephone Encounter (Signed)
Sent. Thanks.   

## 2016-10-28 ENCOUNTER — Other Ambulatory Visit: Payer: Self-pay | Admitting: *Deleted

## 2016-10-28 NOTE — Telephone Encounter (Signed)
Faxed refill request. Amitriptyline Last office visit:   03/31/2016 Last Filled:    90 tablet 2 10/21/2016  Patient requests 90 day supply

## 2016-10-30 ENCOUNTER — Telehealth: Payer: Worker's Compensation | Admitting: Physician Assistant

## 2016-10-30 DIAGNOSIS — R6889 Other general symptoms and signs: Secondary | ICD-10-CM

## 2016-10-30 DIAGNOSIS — R079 Chest pain, unspecified: Secondary | ICD-10-CM

## 2016-10-30 NOTE — Progress Notes (Signed)
Based on what you shared with me it looks like you have a serious condition that should be evaluated in a face to face office visit.  NOTE: Even if you have entered your credit card information for this eVisit, you will not be charged.   If you are having a true medical emergency please call 911.  If you need an urgent face to face visit, [facility] has four urgent care centers for your convenience.  If you need care fast and have a high deductible or no insurance consider:   [email or web address]  [phone]  [address], [address] [place], [place] [address] 8 am to 8 pm Monday-Friday 10 am to 4 pm Saturday-Sunday   The following sites will take your  insurance:    . [facility]  [phone] Get Driving Directions Find a Provider at this Location  [address] [place], [place] [address] . 10 am to 8 pm Monday-Friday . 12 pm to 8 pm Saturday-Sunday   . [facility] at [facility]  [phone] Get Driving Directions Find a Provider at this Location  [address] [place], [place] [address] . 8 am to 8 pm Monday-Friday . 9 am to 6 pm Saturday . 11 am to 6 pm Sunday   . [facility] at [facility]  [phone] Get Driving Directions  [address] [place], [place] [address] . 8 am to 8 pm Monday-Friday . 8 am to 4 pm Saturday-Sunday   Your e-visit answers were reviewed by a board certified advanced clinical practitioner to complete your personal care plan.  Thank you for using e-Visits. e

## 2016-10-31 ENCOUNTER — Ambulatory Visit (INDEPENDENT_AMBULATORY_CARE_PROVIDER_SITE_OTHER): Payer: BLUE CROSS/BLUE SHIELD | Admitting: Internal Medicine

## 2016-10-31 ENCOUNTER — Encounter: Payer: Self-pay | Admitting: Internal Medicine

## 2016-10-31 ENCOUNTER — Ambulatory Visit (INDEPENDENT_AMBULATORY_CARE_PROVIDER_SITE_OTHER)
Admission: RE | Admit: 2016-10-31 | Discharge: 2016-10-31 | Disposition: A | Discharge status: Home / Self Care | Payer: BLUE CROSS/BLUE SHIELD | Source: Ambulatory Visit | Attending: Internal Medicine | Admitting: Internal Medicine

## 2016-10-31 VITALS — BP 126/82 | HR 87 | Temp 98.3°F | Ht 65.0 in | Wt 207.0 lb

## 2016-10-31 DIAGNOSIS — R05 Cough: Secondary | ICD-10-CM

## 2016-10-31 DIAGNOSIS — R062 Wheezing: Secondary | ICD-10-CM

## 2016-10-31 DIAGNOSIS — R059 Cough, unspecified: Secondary | ICD-10-CM | POA: Insufficient documentation

## 2016-10-31 MED ORDER — HYDROCODONE-HOMATROPINE 5-1.5 MG/5ML PO SYRP: 5.0000 mL | ORAL_SOLUTION | Freq: Four times a day (QID) | ORAL | 0 refills | Status: AC | PRN

## 2016-10-31 MED ORDER — LEVOFLOXACIN 500 MG PO TABS: 500.0000 mg | ORAL_TABLET | Freq: Every day | ORAL | 0 refills | Status: AC

## 2016-10-31 MED ORDER — PREDNISONE 10 MG PO TABS
ORAL_TABLET | ORAL | 0 refills | Status: DC
Start: 2016-10-31 — End: 2017-04-22

## 2016-10-31 NOTE — Patient Instructions (Signed)
Please take all new medication as prescribed - the antibiotic, cough medicine if needed, and prednisone  Please continue all other medications as before, and refills have been done if requested.  Please have the pharmacy call with any other refills you may need.  Please keep your appointments with your specialists as you may have planned  Please go to the XRAY Department in the Basement (go straight as you get off the elevator) for the x-ray testing  You will be contacted by phone if any changes need to be made immediately.  Otherwise, you will receive a letter about your results with an explanation, but please check with MyChart first.  Please remember to sign up for MyChart if you have not done so, as this will be important to you in the future with finding out test results, communicating by private email, and scheduling acute appointments online when needed.     

## 2016-10-31 NOTE — Assessment & Plan Note (Signed)
Mild to mod, c/w bronchitis vs pna, for antibx course, cough med prn, for cxr,  to f/u any worsening symptoms or concerns °

## 2016-10-31 NOTE — Progress Notes (Signed)
Subjective:    Patient ID: Sherry Mendoza, female    DOB: 13-Sep-1965, 51 y.o.   MRN: [id number]  HPI  Here with acute onset fever to 102, mild to mod 2-3 days ST, HA, general weakness and malaise, with prod cough greenish sputum, but Pt denies chest pain, increased sob or doe, wheezing, orthopnea, PND, increased LE swelling, palpitations, dizziness or syncope, except for mild wheezing, sob/doe since last PM.   Pt denies polydipsia, polyuria.  Past Medical History:  Diagnosis Date  . Frequent UTI    in past-with uro w/u and urethral dilation  . Guillain-Barre (HCC) 06/1998   Guillain-Barre Plasmaphoresis- hospitalized x 1 week at [facility]   Past Surgical History:  Procedure Laterality Date  . TONSILLECTOMY  1985    reports that she has never smoked. She has never used smokeless tobacco. She reports that she does not drink alcohol or use drugs. She was adopted. Family history is unknown by patient. Allergies  Allergen Reactions  . Influenza Vaccines     H/o gillian barre syndrome  . Nitrofurantoin     REACTION: ELEVATED LIVER FUNCTIONS  . Sulfonamide Derivatives     REACTION: ITCHING   Current Outpatient Prescriptions on File Prior to Visit  Medication Sig Dispense Refill  . amitriptyline (ELAVIL) 10 MG tablet TAKE 1-3 TABLETS (10-30 MG TOTAL) BY MOUTH AT BEDTIME. 90 tablet 2  . ciprofloxacin (CIPRO) 500 MG tablet Take 1 tablet (500 mg total) by mouth 2 (two) times daily. 20 tablet 0  . famotidine (PEPCID) 20 MG tablet Take 1 tablet (20 mg total) by mouth 2 (two) times daily. 60 tablet 0  . SUMAtriptan (IMITREX) 100 MG tablet TAKE 1 TABLET BY MOUTH AT ONSET OF HEADACHE. MAY REPEAT IN 2 HOURS IF NEEDED 10 tablet 1   No current facility-administered medications on file prior to visit.    Review of Systems   Constitutional: Negative for other unusual diaphoresis or sweats HENT: Negative for ear discharge or swelling Eyes: Negative for other worsening visual disturbances Respiratory:  Negative for stridor or other swelling  Gastrointestinal: Negative for worsening distension or other blood Genitourinary: Negative for retention or other urinary change Musculoskeletal: Negative for other MSK pain or swelling Skin: Negative for color change or other new lesions Neurological: Negative for worsening tremors and other numbness  Psychiatric/Behavioral: Negative for worsening agitation or other fatigue All other system neg per pt    Objective:   Physical Exam BP 126/82   Pulse 87   Temp 98.3 F (36.8 C) (Oral)   Ht  (1.651 m)   Wt 207 lb (93.9 kg)   SpO2 97%   BMI 34.45 kg/m  VS noted, mild ill Constitutional: Pt appears in NAD HENT: Head: NCAT.  Right Ear: External ear normal.  Left Ear: External ear normal.  Eyes: . Pupils are equal, round, and reactive to light. Conjunctivae and EOM are normal Nose: without d/c or deformity Bilat tm's with mild erythema.  Max sinus areas non tender.  Pharynx with mild erythema, no exudate Neck: Neck supple. Gross normal ROM Cardiovascular: Normal rate and regular rhythm.   Pulmonary/Chest: Effort normal and breath sounds decreased with few RLL crackles only, but with few bilat scattered wheezing.  Abd:  Soft, NT, ND, + BS, no organomegaly Neurological: Pt is alert. At baseline orientation, motor grossly intact Skin: Skin is warm. No rashes, other new lesions, no LE edema Psychiatric: Pt behavior is normal without agitation  No other exam findings  Assessment & Plan:

## 2016-10-31 NOTE — Assessment & Plan Note (Signed)
Mild to mod, for predpac asd, inhaler prn, to f/u any worsening symptoms or concerns 

## 2016-11-03 MED ORDER — AMITRIPTYLINE HCL 10 MG PO TABS: 10.0000 mg | ORAL_TABLET | Freq: Every day | ORAL | 1 refills | Status: DC

## 2016-11-03 NOTE — Telephone Encounter (Signed)
Patient requests 90 day supply with directions 1-3 tabs at bedtime.  Please advise.

## 2016-11-03 NOTE — Telephone Encounter (Signed)
Sent. Thanks.   

## 2016-12-22 DIAGNOSIS — M9903 Segmental and somatic dysfunction of lumbar region: Secondary | ICD-10-CM | POA: Diagnosis not present

## 2016-12-22 DIAGNOSIS — M9904 Segmental and somatic dysfunction of sacral region: Secondary | ICD-10-CM | POA: Diagnosis not present

## 2016-12-22 DIAGNOSIS — M5387 Other specified dorsopathies, lumbosacral region: Secondary | ICD-10-CM | POA: Diagnosis not present

## 2017-04-22 ENCOUNTER — Encounter: Payer: Self-pay | Admitting: Family Medicine

## 2017-04-22 ENCOUNTER — Ambulatory Visit (INDEPENDENT_AMBULATORY_CARE_PROVIDER_SITE_OTHER): Payer: BLUE CROSS/BLUE SHIELD | Admitting: Family Medicine

## 2017-04-22 VITALS — BP 122/82 | HR 74 | Temp 98.6°F | Wt 218.0 lb

## 2017-04-22 DIAGNOSIS — J019 Acute sinusitis, unspecified: Secondary | ICD-10-CM

## 2017-04-22 MED ORDER — FLUTICASONE PROPIONATE 50 MCG/ACT NA SUSP
2.0000 | Freq: Every day | NASAL | 1 refills | Status: DC
Start: 2017-04-22 — End: 2018-02-08

## 2017-04-22 MED ORDER — AMOXICILLIN-POT CLAVULANATE 875-125 MG PO TABS: 1.0000 | ORAL_TABLET | Freq: Two times a day (BID) | ORAL | 0 refills | Status: AC

## 2017-04-22 NOTE — Patient Instructions (Addendum)
You have a sinus infection, possibly still viral. Push fluids and plenty of rest. Take ibuprofen 600mg  with meals (3 times a day) for next 3-5 days.  Nasal saline irrigation or neti pot to help drain sinuses. Start flonase nasal steroid 2 sprays into each nostril.  May use plain mucinex with plenty of fluid to help mobilize mucous. If fever >101, worsening symptoms or ongoing symptoms past 10 days, fill antibiotic provided today (augmentin).

## 2017-04-22 NOTE — Progress Notes (Signed)
BP 122/82 (BP Location: Left Arm, Patient Position: Sitting, Cuff Size: Large)   Pulse 74   Temp 98.6 F (37 C) (Oral)   Wt 218 lb (98.9 kg)   SpO2 98%   BMI 36.28 kg/m    CC: sinus congestion Subjective:    Patient ID: Sherry Mendoza, female    DOB: 1965/06/20, 52 y.o.   MRN: [id number]  HPI: Sherry Mendoza is a 52 y.o. female presenting on 04/22/2017 for Sinus Problem (Sinus congestion, facial and teeth pain and HA. Also, has productive cough with green mucous.  Mid chest pain with cough. Denies any fever.  Tried Dayquil and ibuprofen. )   5d h/o head congestion, L epistaxis, R earache, productive cough of green sputum with chest discomfort. L tooth pain, L sinus pain/pressure. PNdrainage. Bad ST and HA worse in the mornings. Symptoms progressively worsening.   No fevers/chills, dyspnea or wheezing.   Has tried dayquil, ibuprofen 600mg  twice daily with meals.   + sick contacts at home and work.  No smokers at home. No h/o asthma.   Relevant past medical, surgical, family and social history reviewed and updated as indicated. Interim medical history since our last visit reviewed. Allergies and medications reviewed and updated. Outpatient Medications Prior to Visit  Medication Sig Dispense Refill  . amitriptyline (ELAVIL) 10 MG tablet TAKE 1-3 TABLETS (10-30 MG TOTAL) BY MOUTH AT BEDTIME. 90 tablet 2  . SUMAtriptan (IMITREX) 100 MG tablet TAKE 1 TABLET BY MOUTH AT ONSET OF HEADACHE. MAY REPEAT IN 2 HOURS IF NEEDED 10 tablet 1  . amitriptyline (ELAVIL) 10 MG tablet Take 1-3 tablets (10-30 mg total) by mouth at bedtime. 270 tablet 1  . ciprofloxacin (CIPRO) 500 MG tablet Take 1 tablet (500 mg total) by mouth 2 (two) times daily. 20 tablet 0  . famotidine (PEPCID) 20 MG tablet Take 1 tablet (20 mg total) by mouth 2 (two) times daily. 60 tablet 0  . predniSONE (DELTASONE) 10 MG tablet 2 tabs by mouth per day for 5 days 10 tablet 0   No facility-administered medications prior to  visit.      Per HPI unless specifically indicated in ROS section below Review of Systems     Objective:    BP 122/82 (BP Location: Left Arm, Patient Position: Sitting, Cuff Size: Large)   Pulse 74   Temp 98.6 F (37 C) (Oral)   Wt 218 lb (98.9 kg)   SpO2 98%   BMI 36.28 kg/m   Wt Readings from Last 3 Encounters:  04/22/17 218 lb (98.9 kg)  10/31/16 207 lb (93.9 kg)  06/23/16 202 lb (91.6 kg)    Physical Exam  Constitutional: She appears well-developed and well-nourished. No distress.  HENT:  Head: Normocephalic and atraumatic.  Right Ear: Hearing, external ear and ear canal normal.  Left Ear: Hearing, tympanic membrane, external ear and ear canal normal.  Nose: Mucosal edema (nasal mucosal congestion, erythema, inflammation) and rhinorrhea present. Right sinus exhibits no maxillary sinus tenderness and no frontal sinus tenderness. Left sinus exhibits maxillary sinus tenderness (mild) and frontal sinus tenderness (mild).  Mouth/Throat: Uvula is midline and mucous membranes are normal. Posterior oropharyngeal edema and posterior oropharyngeal erythema present. No oropharyngeal exudate or tonsillar abscesses.  Bulging R TM without significant erythema Papules at posterior oropharynx  Eyes: Conjunctivae and EOM are normal. Pupils are equal, round, and reactive to light. No scleral icterus.  Neck: Normal range of motion. Neck supple. No thyromegaly present.  Cardiovascular: Normal rate,  regular rhythm, normal heart sounds and intact distal pulses.  No murmur heard. Pulmonary/Chest: Effort normal and breath sounds normal. No respiratory distress. She has no wheezes. She has no rales.  Lungs clear  Lymphadenopathy:    She has no cervical adenopathy.  Skin: Skin is warm and dry. No rash noted.  Nursing note and vitals reviewed.     Assessment & Plan:   Problem List Items Addressed This Visit    Acute sinusitis - Primary    Short duration but pretty severe symptoms. Discussed  possible viral vs bacterial cause.  rec few days of supportive therapy with NSAID, fluids, rest, flonase, mucinex and nyquil. If worsening symptoms or not improving then WASP for augmentin provided to fill. Pt agrees with plan.       Relevant Medications   fluticasone (FLONASE) 50 MCG/ACT nasal spray   amoxicillin-clavulanate (AUGMENTIN) 875-125 MG tablet       Meds ordered this encounter  Medications  . fluticasone (FLONASE) 50 MCG/ACT nasal spray    Sig: Place 2 sprays into both nostrils daily.    Dispense:  16 g    Refill:  1  . amoxicillin-clavulanate (AUGMENTIN) 875-125 MG tablet    Sig: Take 1 tablet by mouth 2 (two) times daily for 10 days.    Dispense:  20 tablet    Refill:  0   No orders of the defined types were placed in this encounter.   Follow up plan: No Follow-up on file.  Eustaquio Boyden, MD

## 2017-04-22 NOTE — Assessment & Plan Note (Signed)
Short duration but pretty severe symptoms. Discussed possible viral vs bacterial cause.  rec few days of supportive therapy with NSAID, fluids, rest, flonase, mucinex and nyquil. If worsening symptoms or not improving then WASP for augmentin provided to fill. Pt agrees with plan.

## 2017-05-11 ENCOUNTER — Other Ambulatory Visit: Payer: Self-pay | Admitting: Family Medicine

## 2017-05-11 NOTE — Telephone Encounter (Signed)
Electronic refill request Last refill 05/14/16 #10/1 Last office visit 04/22/17-acute

## 2017-05-12 ENCOUNTER — Encounter: Payer: Self-pay | Admitting: *Deleted

## 2017-05-12 NOTE — Telephone Encounter (Signed)
Letter mailed

## 2017-05-12 NOTE — Telephone Encounter (Signed)
Sent.  Due for CPE when possible.  Thanks.  

## 2017-06-19 DIAGNOSIS — J0100 Acute maxillary sinusitis, unspecified: Secondary | ICD-10-CM | POA: Diagnosis not present

## 2017-06-19 DIAGNOSIS — J029 Acute pharyngitis, unspecified: Secondary | ICD-10-CM | POA: Diagnosis not present

## 2017-06-25 ENCOUNTER — Ambulatory Visit (INDEPENDENT_AMBULATORY_CARE_PROVIDER_SITE_OTHER): Payer: BLUE CROSS/BLUE SHIELD | Admitting: Obstetrics and Gynecology

## 2017-06-25 ENCOUNTER — Encounter: Payer: Self-pay | Admitting: Obstetrics and Gynecology

## 2017-06-25 VITALS — BP 126/88 | HR 68 | Ht 65.0 in | Wt 222.0 lb

## 2017-06-25 DIAGNOSIS — Z124 Encounter for screening for malignant neoplasm of cervix: Secondary | ICD-10-CM

## 2017-06-25 DIAGNOSIS — Z0000 Encounter for general adult medical examination without abnormal findings: Secondary | ICD-10-CM

## 2017-06-25 DIAGNOSIS — Z01419 Encounter for gynecological examination (general) (routine) without abnormal findings: Secondary | ICD-10-CM | POA: Diagnosis not present

## 2017-06-25 DIAGNOSIS — Z131 Encounter for screening for diabetes mellitus: Secondary | ICD-10-CM | POA: Diagnosis not present

## 2017-06-25 DIAGNOSIS — Z1239 Encounter for other screening for malignant neoplasm of breast: Secondary | ICD-10-CM

## 2017-06-25 DIAGNOSIS — Z1231 Encounter for screening mammogram for malignant neoplasm of breast: Secondary | ICD-10-CM

## 2017-06-25 DIAGNOSIS — Z1329 Encounter for screening for other suspected endocrine disorder: Secondary | ICD-10-CM

## 2017-06-25 DIAGNOSIS — Z13220 Encounter for screening for lipoid disorders: Secondary | ICD-10-CM

## 2017-06-25 DIAGNOSIS — R87611 Atypical squamous cells cannot exclude high grade squamous intraepithelial lesion on cytologic smear of cervix (ASC-H): Secondary | ICD-10-CM | POA: Diagnosis not present

## 2017-06-25 MED ORDER — PHENTERMINE HCL 37.5 MG PO TABS: 37.5000 mg | ORAL_TABLET | Freq: Every day | ORAL | 0 refills | Status: DC

## 2017-06-25 NOTE — Patient Instructions (Signed)

## 2017-06-25 NOTE — Progress Notes (Signed)
Patient ID: Sherry Mendoza, female   DOB: [birth year], 52 y.o.   MRN: [id number]      Gynecology Annual Exam  PCP: Sherry Nam, MD  Chief Complaint:  Chief Complaint  Patient presents with  . Gynecologic Exam    Migraines    History of Present Illness:Patient is a 52 y.o. G2P2002 presents for annual exam. The patient has no complaints today.   LMP: No LMP recorded. (Menstrual status: Perimenopausal). Average Interval: irregular, 3-4 months Duration of flow: 5 days Heavy Menses: no Clots: no Intermenstrual Bleeding: no Postcoital Bleeding: no Dysmenorrhea: no  Mild vasomotor symptoms.  The patient does perform self breast exams.  There is no notable family history of breast or ovarian cancer in her family.  The patient wears seatbelts: yes.   The patient has regular exercise: not asked.    The patient denies current symptoms of depression.      Patientis a 52 y.o. [id number] female, who presents for the evaluation of weight gain. She has gained 20 pounds primarily over 12 months. The patient states the following issues have contributed to her weight problem: non-identified.  The patient has no additional symptoms. The patient specifically denies memory loss, muscle weakness, excessive thirst, and polyuria. Weight related co-morbidities include none.   Review of Systems: Review of Systems  Constitutional: Negative.   HENT: Positive for sore throat. Negative for congestion, ear discharge, ear pain, hearing loss, nosebleeds, sinus pain and tinnitus.   Eyes: Negative.   Respiratory: Positive for cough. Negative for hemoptysis, shortness of breath and stridor.   Cardiovascular: Negative.   Gastrointestinal: Negative.   Genitourinary: Negative.   Musculoskeletal: Negative.   Skin: Negative.   Neurological: Negative.   Endo/Heme/Allergies: Positive for environmental allergies. Negative for polydipsia. Does not bruise/bleed easily.  Psychiatric/Behavioral: Negative.      Past Medical History:  Past Medical History:  Diagnosis Date  . Frequent UTI    in past-with uro w/u and urethral dilation  . Guillain-Barre (HCC) 06/1998   Guillain-Barre Plasmaphoresis- hospitalized x 1 week at [facility]    Past Surgical History:  Past Surgical History:  Procedure Laterality Date  . TONSILLECTOMY  1985    Gynecologic History:  No LMP recorded. (Menstrual status: Perimenopausal). Last Pap: Results were:06/23/2016 HSIL HPV positive   Obstetric History: [id number]  Family History:  Family History  Adopted: Yes  Family history unknown: Yes    Social History:  Social History   Socioeconomic History  . Marital status: Married    Spouse name: Not on file  . Number of children: Not on file  . Years of education: Not on file  . Highest education level: Not on file  Occupational History  . Occupation: Housewife/Teacher    Employer: [employer]  Social Needs  . Financial resource strain: Not on file  . Food insecurity:    Worry: Not on file    Inability: Not on file  . Transportation needs:    Medical: Not on file    Non-medical: Not on file  Tobacco Use  . Smoking status: Never Smoker  . Smokeless tobacco: Never Used  Substance and Sexual Activity  . Alcohol use: No    Alcohol/week: 0.0 oz  . Drug use: No  . Sexual activity: Yes    Birth control/protection: None  Lifestyle  . Physical activity:    Days per week: 2 days    Minutes per session: 30 min  . Stress: Not at all  Relationships  . Social connections:  Talks on phone: Not on file    Gets together: Not on file    Attends religious service: Not on file    Active member of club or organization: Not on file    Attends meetings of clubs or organizations: Not on file    Relationship status: Not on file  . Intimate partner violence:    Fear of current or ex partner: Not on file    Emotionally abused: Not on file    Physically abused: Not on file    Forced sexual activity: Not on file  Other  Topics Concern  . Not on file  Social History Narrative   Unknown family history-adopted   Married 1991- lives with husband   2 children   Housewife-teaching at [employer]    Allergies:  Allergies  Allergen Reactions  . Influenza Vaccines     H/o gillian barre syndrome  . Nitrofurantoin     REACTION: ELEVATED LIVER FUNCTIONS  . Sulfonamide Derivatives     REACTION: ITCHING    Medications: Prior to Admission medications   Medication Sig Start Date End Date Taking? Authorizing Provider  fluticasone (FLONASE) 50 MCG/ACT nasal spray Place 2 sprays into both nostrils daily. 04/22/17  Yes Eustaquio Boyden, MD  SUMAtriptan (IMITREX) 100 MG tablet TAKE 1 TABLET BY MOUTH AT ONSET OF HEADACHE. MAY REPEAT IN 2 HOURS IF NEEDED 05/14/16  Yes Sherry Nam, MD  amitriptyline (ELAVIL) 10 MG tablet TAKE 1-3 TABLETS (10-30 MG TOTAL) BY MOUTH AT BEDTIME. Patient not taking: Reported on 06/25/2017 10/21/16   Sherry Nam, MD    Physical Exam Vitals: Blood pressure 126/88, pulse 68, height  (1.651 m), weight 222 lb (100.7 kg).  General: NAD HEENT: normocephalic, anicteric Thyroid: no enlargement, no palpable nodules Pulmonary: No increased work of breathing, CTAB Cardiovascular: RRR, distal pulses 2+ Breast: Breast symmetrical, no tenderness, no palpable nodules or masses, no skin or nipple retraction present, no nipple discharge.  No axillary or supraclavicular lymphadenopathy. Abdomen: NABS, soft, non-tender, non-distended.  Umbilicus without lesions.  No hepatomegaly, splenomegaly or masses palpable. No evidence of hernia  Genitourinary:  External: Normal external female genitalia.  Normal urethral meatus, normal Bartholin's and Skene's glands.    Vagina: Normal vaginal mucosa, no evidence of prolapse.    Cervix: Grossly normal in appearance, no bleeding  Uterus: Non-enlarged, mobile, normal contour.  No CMT  Adnexa: ovaries non-enlarged, no adnexal  masses  Rectal: deferred  Lymphatic: no evidence of inguinal lymphadenopathy Extremities: no edema, erythema, or tenderness Neurologic: Grossly intact Psychiatric: mood appropriate, affect full  Female chaperone present for pelvic and breast  portions of the physical exam     Assessment: 52 y.o. G2P2002 routine annual exam  Plan: Problem List Items Addressed This Visit    None    Visit Diagnoses    Screening for diabetes mellitus    -  Primary   Relevant Orders   CMP14+LP+TP+TSH+CBC/Plt   Screening for malignant neoplasm of cervix       Relevant Orders   PapIG, CtNgTv, HPV, rfx 16/18   Breast screening       Relevant Orders   MM DIGITAL SCREENING BILATERAL   Encounter for gynecological examination without abnormal finding       Relevant Orders   PapIG, CtNgTv, HPV, rfx 16/18   CMP14+LP+TP+TSH+CBC/Plt   Thyroid disorder screen       Relevant Orders   CMP14+LP+TP+TSH+CBC/Plt   Lipid screening       Relevant Orders  CMP14+LP+TP+TSH+CBC/Plt      1) Mammogram - recommend yearly screening mammogram.  Mammogram Was ordered today  2) STI screening  was notoffered and therefore not obtained  3) ASCCP guidelines and rational discussed.   - discussed proceeding with colposcopy given last years pap read - at present would be more interested in pursuing LEEP if still abnormal  4) Osteoporosis  - per USPTF routine screening DEXA at age 83  5) Routine healthcare maintenance including cholesterol, diabetes screening discussed Ordered today - non-fasting  6) Colonoscopy - declines  7) Return in 1 month (on 07/23/2017) for wt check.  Weight Loss  1) 1500 Calorie ADA Diet  2) Patient education given regarding appropriate lifestyle changes for weight loss including: regular physical activity, healthy coping strategies, caloric restriction and healthy eating patterns.  3) Patient will be started on weight loss medication. The risks and benefits and side effects of  medication, such as Adipex (Phenteramine) ,  Tenuate (Diethylproprion), Belviq (lorcarsin), Contrave (buproprion/naltrexone), Qsymia (phentermine/topiramate), and Saxenda (liraglutide) is discussed. The pros and cons of suppressing appetite and boosting metabolism is discussed. Risks of tolerence and addiction is discussed for selected agents discussed. Use of medicine will ne short term, such as 3-4 months at a time followed by a period of time off of the medicine to avoid these risks and side effects for Adipex, Qsymia, and Tenuate discussed. Pt to call with any negative side effects and agrees to keep follow up appts.  4) Comorbidity Screening - hypothyroidism screening, diabetes, and hyperlipidemia screening offered  5) Encouraged weekly weight monitorig to track progress and sample 1 week food diary  6)  15 minutes face-to-face; counseling/coordination of care > 50 percent of visit  7) Follow up in 4 weeks to assess response     Vena Austria, MD Domingo Pulse, [facility]

## 2017-06-26 ENCOUNTER — Encounter (INDEPENDENT_AMBULATORY_CARE_PROVIDER_SITE_OTHER): Payer: Self-pay

## 2017-06-26 LAB — CMP14+LP+TP+TSH+CBC/PLT
ALT: 19 IU/L (ref 0–32)
AST: 21 IU/L (ref 0–40)
Albumin/Globulin Ratio: 2.1 (ref 1.2–2.2)
Albumin: 4.1 g/dL (ref 3.5–5.5)
Alkaline Phosphatase: 58 IU/L (ref 39–117)
BUN/Creatinine Ratio: 15 (ref 9–23)
BUN: 10 mg/dL (ref 6–24)
Bilirubin Total: 0.3 mg/dL (ref 0.0–1.2)
CO2: 24 mmol/L (ref 20–29)
Calcium: 9.5 mg/dL (ref 8.7–10.2)
Chloride: 104 mmol/L (ref 96–106)
Cholesterol, Total: 157 mg/dL (ref 100–199)
Creatinine, Ser: 0.65 mg/dL (ref 0.57–1.00)
Free Thyroxine Index: 1.5 (ref 1.2–4.9)
GFR calc Af Amer: 116 mL/min/{1.73_m2} (ref >59)
GFR calc non Af Amer: 101 mL/min/{1.73_m2} (ref >59)
Globulin, Total: 2 g/dL (ref 1.5–4.5)
Glucose: 86 mg/dL (ref 65–99)
HDL: 55 mg/dL (ref >39)
Hematocrit: 37.5 % (ref 34.0–46.6)
Hemoglobin: 12.2 g/dL (ref 11.1–15.9)
LDL Calculated: 85 mg/dL (ref 0–99)
LDl/HDL Ratio: 1.5 ratio (ref 0.0–3.2)
MCH: 28.4 pg (ref 26.6–33.0)
MCHC: 32.5 g/dL (ref 31.5–35.7)
MCV: 87 fL (ref 79–97)
Platelets: 350 10*3/uL (ref 150–450)
Potassium: 4.7 mmol/L (ref 3.5–5.2)
RBC: 4.29 x10E6/uL (ref 3.77–5.28)
RDW: 13.2 % (ref 12.3–15.4)
Sodium: 139 mmol/L (ref 134–144)
T3 Uptake Ratio: 21 % — ABNORMAL LOW (ref 24–39)
T4, Total: 7.2 ug/dL (ref 4.5–12.0)
TSH: 4.07 u[IU]/mL (ref 0.450–4.500)
Total Protein: 6.1 g/dL (ref 6.0–8.5)
Triglycerides: 86 mg/dL (ref 0–149)
VLDL Cholesterol Cal: 17 mg/dL (ref 5–40)
WBC: 6.9 10*3/uL (ref 3.4–10.8)

## 2017-07-01 LAB — PAPIG, CTNGTV, HPV, RFX 16/18
Chlamydia, Nuc. Acid Amp: NEGATIVE
Gonococcus, Nuc. Acid Amp: NEGATIVE
HPV, high-risk: POSITIVE — AB
PAP Smear Comment: 0
Trich vag by NAA: NEGATIVE

## 2017-07-03 ENCOUNTER — Telehealth: Payer: Self-pay | Admitting: Obstetrics and Gynecology

## 2017-07-03 NOTE — Telephone Encounter (Signed)
Lmtrc

## 2017-07-03 NOTE — Telephone Encounter (Signed)
-----   Message from Vena Austria, MD sent at 07/03/2017  8:10 AM EDT ----- Regarding: Surgery Surgery Date: 1-2 weeks  LOS: same day surgery  Surgery Booking Request Patient Full Name: CALVARY DIFRANCO MRN: [id number]  DOB: 14-Mar-1965  Surgeon: Vena Austria, MD  Requested Surgery Date and Time: 1-2 weeks Primary Diagnosis and Code: High Grade Dysplasia Secondary Diagnosis and Code:  Surgical Procedure: LEEP L&D Notification:N/A Admission Status: same day surgery Length of Surgery: 1h Special Case Needs: none H&P: day of or week prior (date) Phone Interview or Office Pre-Admit: phone interview Interpreter: No Language: English Medical Clearance: No Special Scheduling Instructions:

## 2017-07-08 NOTE — Telephone Encounter (Signed)
Patient is aware of [facility] @ 9:50am w/ Dr. Bonney AidStaebler, Pre-admit Testing phone interview to be scheduled, and OR on 07/30/17. Patient is aware she may receive calls from the [facility]. Patient confirmed she has BCBS, and no secondary insurance. Ext given.

## 2017-07-22 ENCOUNTER — Encounter: Payer: Self-pay | Admitting: Obstetrics and Gynecology

## 2017-07-22 ENCOUNTER — Ambulatory Visit: Payer: BLUE CROSS/BLUE SHIELD | Admitting: Obstetrics and Gynecology

## 2017-07-22 ENCOUNTER — Ambulatory Visit (INDEPENDENT_AMBULATORY_CARE_PROVIDER_SITE_OTHER): Payer: BLUE CROSS/BLUE SHIELD | Admitting: Obstetrics and Gynecology

## 2017-07-22 VITALS — BP 112/70 | HR 86 | Ht 65.0 in | Wt 206.0 lb

## 2017-07-22 DIAGNOSIS — Z6835 Body mass index (BMI) 35.0-35.9, adult: Secondary | ICD-10-CM | POA: Diagnosis not present

## 2017-07-22 DIAGNOSIS — E669 Obesity, unspecified: Secondary | ICD-10-CM | POA: Diagnosis not present

## 2017-07-22 DIAGNOSIS — Z01818 Encounter for other preprocedural examination: Secondary | ICD-10-CM | POA: Diagnosis not present

## 2017-07-22 MED ORDER — PHENTERMINE HCL 37.5 MG PO TABS: 37.5000 mg | ORAL_TABLET | Freq: Every day | ORAL | 0 refills | Status: DC

## 2017-07-22 NOTE — Progress Notes (Signed)
Obstetrics & Gynecology Surgery H&P    Chief Complaint: Scheduled Surgery   History of Present Illness: Patient is a 52 y.o. [id number] presenting for scheduled LEEP, for the treatment or further evaluation of ASC-H.   Prior Treatments prior to proceeding with surgery include: none  Preoperative Pap: 06/23/2016 HSIL pap, 06/25/2017 ASC-H  Preoperative Endometrial biopsy: N/A Preoperative Ultrasound: N/A  Patientis a 52 y.o. [id number] female, who presents for the evaluation of the desire to lose weight. She has lost 16 pounds in 1 months. The patient states the following symptoms since starting her weight loss therapy: appetite suppression, energy, and weight loss.  The patient also reports no other ill effects. The patient specifically denies heart palpitations, anxiety, and insomnia.    Review of Systems:10 point review of systems  Past Medical History:  Past Medical History:  Diagnosis Date  . Frequent UTI    in past-with uro w/u and urethral dilation  . Guillain-Barre (HCC) 06/1998   Guillain-Barre Plasmaphoresis- [facility] 1 [facility]    Past Surgical History:  Past Surgical History:  Procedure Laterality Date  . TONSILLECTOMY  1985    Family History:  Family History  Adopted: Yes  Family history unknown: Yes    Social History:  Social History   Socioeconomic History  . Marital status: Married    Spouse name: Not on file  . Number of children: Not on file  . Years of education: Not on file  . Highest education level: Not on file  Occupational History  . Occupation: Housewife/Teacher    Employer: [employer]  Social Needs  . Financial resource strain: Not on file  . Food insecurity:    Worry: Not on file    Inability: Not on file  . Transportation needs:    Medical: Not on file    Non-medical: Not on file  Tobacco Use  . Smoking status: Never Smoker  . Smokeless tobacco: Never Used  Substance and Sexual Activity  . Alcohol use: No    Alcohol/week: 0.0  oz  . Drug use: No  . Sexual activity: Yes    Birth control/protection: None  Lifestyle  . Physical activity:    Days per week: 2 days    Minutes per session: 30 min  . Stress: Not at all  Relationships  . Social connections:    Talks on phone: Not on file    Gets together: Not on file    Attends religious service: Not on file    Active member of club or organization: Not on file    Attends meetings of clubs or organizations: Not on file    Relationship status: Not on file  . Intimate partner violence:    Fear of current or ex partner: Not on file    Emotionally abused: Not on file    Physically abused: Not on file    Forced sexual activity: Not on file  Other Topics Concern  . Not on file  Social History Narrative   Unknown family history-adopted   Married 1991- lives with husband   2 children   Housewife-teaching at [employer]    Allergies:  Allergies  Allergen Reactions  . Influenza Vaccines     H/o gillian barre syndrome  . Nitrofurantoin Other (See Comments)    ELEVATED LIVER FUNCTIONS  . Sulfonamide Derivatives Itching    Medications: Prior to Admission medications   Medication Sig Start Date End Date Taking? Authorizing Provider  phentermine (ADIPEX-P) 37.5 MG tablet  Take 1 tablet (37.5 mg total) by mouth daily before breakfast. 06/25/17  Yes Vena Austria, MD  SUMAtriptan (IMITREX) 100 MG tablet TAKE 1 TABLET BY MOUTH AT ONSET OF HEADACHE. MAY REPEAT IN 2 HOURS IF NEEDED 05/14/16  Yes Joaquim Nam, MD  amitriptyline (ELAVIL) 10 MG tablet TAKE 1-3 TABLETS (10-30 MG TOTAL) BY MOUTH AT BEDTIME. Patient not taking: Reported on 06/25/2017 10/21/16   Joaquim Nam, MD  fluticasone [facility]) 50 MCG/ACT nasal spray Place 2 sprays into both nostrils daily. Patient not taking: Reported on 07/20/2017 04/22/17   Eustaquio Boyden, MD  ibuprofen (ADVIL,MOTRIN) 200 MG tablet Take 400-800 mg by mouth every 8 (eight) hours as needed (for pain.).     [provider]    Physical Exam Vitals: Blood pressure 112/70, pulse 86, height 5\' 5"  (1.651 m), weight 206 lb (93.4 kg). Filed Weights   07/22/17 1551  Weight: 206 lb (93.4 kg)  Body mass index is 34.28 kg/m.   General: NAD HEENT: normocephalic, anicteric Pulmonary: CTAB, No increased work of breathing Cardiovascular: RRR, distal pulses 2+ Abdomen: soft, non-tender, non-distended Genitourinary: deferred Extremities: no edema, erythema, or tenderness Neurologic: Grossly intact Psychiatric: mood appropriate, affect full  Imaging No results found.  Assessment: 52 y.o. [id number] presenting for scheduled LEEP and medical weight loss follow up  Plan:  Dysplasia  1) I have had a long and careful discussion with this patient about the pros and cons, risks/benefits of each option available.  She understands that cryotherapy is a minor procedure with approximately a 85% success rate.  She also understands that LEEP cone biopsy is outpatient surgery with a 95% success rate and approximately a 5% recurrence rate.  She understands that this is surgery and has all the risks of any surgery including but not limited to the risks of anesthesia, hemorrhage, infection, perforation, injury to bowel, bladder and blood vessels.  She also understands the unlikely but potential effect on both getting pregnant and the ability of carrying a pregnancy.  Hysterectomy is the final option with a 99% success rate.  After careful discussion of all these options and her unique factors, mutual decision has been made to proceed with a LEEP cone biopsy   2) Routine postoperative instructions were reviewed with the patient and her family in detail today including the expected length of recovery and likely postoperative course.  The patient concurred with the proposed plan, giving informed written consent for the surgery today.  Patient instructed on the importance of being NPO after midnight prior to her  procedure.  If warranted preoperative prophylactic antibiotics and SCDs ordered on call to the OR to meet SCIP guidelines and adhere to recommendation laid forth in ACOG Practice Bulletin Number 104 May 2009  "Antibiotic Prophylaxis for Gynecologic Procedures".    Medical Weight Loss  1) 1500 Calorie ADA Diet  2) Patient education given regarding appropriate lifestyle changes for weight loss including: regular physical activity, healthy coping strategies, caloric restriction and healthy eating patterns.  3) Patient will be started on weight loss medication. The risks and benefits and side effects of medication, such as Adipex (Phenteramine) ,  Tenuate (Diethylproprion), Belviq (lorcarsin), Contrave (buproprion/naltrexone), Qsymia (phentermine/topiramate), and Saxenda (liraglutide) is discussed. The pros and cons of suppressing appetite and boosting metabolism is discussed. Risks of tolerence and addiction is discussed for selected agents discussed. Use of medicine will ne short term, such as 3-4 months at a time followed by a period of time off of the medicine to avoid  these risks and side effects for Adipex, Qsymia, and Tenuate discussed. Pt to call with any negative side effects and agrees to keep follow up appts.  4) Patient to take medication, with the benefits of appetite suppression and metabolism boost d/w pt, along with the side effects and risk factors of long term use that will be avoided with our use of short bursts of therapy. Rx provided phentermine.   5) 15 minutes face-to-face; with counseling/coordination of care > 50 percent of visit related to obesity and ongoing management/treatment   Vena Austria, MD, [name], [facility] 07/22/2017, 4:14 PM

## 2017-07-23 ENCOUNTER — Other Ambulatory Visit: Payer: Self-pay

## 2017-07-23 ENCOUNTER — Encounter
Admission: RE | Admit: 2017-07-23 | Discharge: 2017-07-23 | Disposition: A | Discharge status: Home / Self Care | Payer: BLUE CROSS/BLUE SHIELD | Source: Ambulatory Visit | Attending: Obstetrics and Gynecology | Admitting: Obstetrics and Gynecology

## 2017-07-23 HISTORY — DX: Other specified postprocedural states: R11.2

## 2017-07-23 HISTORY — DX: Other specified postprocedural states: Z98.890

## 2017-07-23 HISTORY — DX: Other complications of anesthesia, initial encounter: T88.59XA

## 2017-07-23 HISTORY — DX: Headache, unspecified: R51.9

## 2017-07-23 HISTORY — DX: Headache: R51

## 2017-07-23 HISTORY — DX: Adverse effect of unspecified anesthetic, initial encounter: T41.45XA

## 2017-07-23 HISTORY — DX: Family history of other specified conditions: Z84.89

## 2017-07-23 NOTE — Patient Instructions (Signed)
Your procedure is scheduled on: 07-30-17 Report to Same Day Surgery 2nd floor medical mall [facility](Medical Mall Entrance-take elevator on left to 2nd floor.  Check in with surgery information desk.) To find out your arrival time please call [phone] between 1PM - 3PM on 07-29-17  Remember: Instructions that are not followed completely may result in serious medical risk, up to and including death, or upon the discretion of your surgeon and anesthesiologist your surgery may need to be rescheduled.    _x___ 1. Do not eat food after midnight the night before your procedure. NO GUM OR CANDY AFTER MIDNIGHT.  You may drink clear liquids up to 2 hours before you are scheduled to arrive at the hospital for your procedure.  Do not drink clear liquids within 2 hours of your scheduled arrival to the hospital.  Clear liquids include  --Water or Apple juice without pulp  --Clear carbohydrate beverage such as ClearFast or Gatorade  --Black Coffee or Clear Tea (No milk, no creamers, do not add anything to the coffee or Tea    __x__ 2. No Alcohol for 24 hours before or after surgery.   __x__3. No Smoking or e-cigarettes for 24 prior to surgery.  Do not use any chewable tobacco products for at least 6 hour prior to surgery   ____  4. Bring all medications with you on the day of surgery if instructed.    __x__ 5. Notify your doctor if there is any change in your medical condition     (cold, fever, infections).    x___6. On the morning of surgery brush your teeth with toothpaste and water.  You may rinse your mouth with mouth wash if you wish.  Do not swallow any toothpaste or mouthwash.   Do not wear jewelry, make-up, hairpins, clips or nail polish.  Do not wear lotions, powders, or perfumes. You may wear deodorant.  Do not shave 48 hours prior to surgery. Men may shave face and neck.  Do not bring valuables to the hospital.    [facility] is not responsible for any belongings or valuables.    Contacts, dentures or bridgework may not be worn into surgery.  Leave your suitcase in the car. After surgery it may be brought to your room.  For patients admitted to the hospital, discharge time is determined by your treatment team.  _  Patients discharged the day of surgery will not be allowed to drive home.  You will need someone to drive you home and stay with you the night of your procedure.    Please read over the following fact sheets that you were given:   [facility] Preparing for Surgery   ____ Take anti-hypertensive listed below, cardiac, seizure, asthma, anti-reflux and psychiatric medicines. These include:  1. NONE  2.  3.  4.  5.  6.  ____Fleets enema or Magnesium Citrate as directed.   ____ Use CHG Soap or sage wipes as directed on instruction sheet   ____ Use inhalers on the day of surgery and bring to hospital day of surgery  ____ Stop Metformin and Janumet 2 days prior to surgery.    ____ Take 1/2 of usual insulin dose the night before surgery and none on the morning surgery.   ____ Follow recommendations from Cardiologist, Pulmonologist or PCP regarding stopping Aspirin, Coumadin, Plavix ,Eliquis, Effient, or Pradaxa, and Pletal.  X____Stop Anti-inflammatories such as Advil, Aleve, Ibuprofen, Motrin, Naproxen, Naprosyn, Goodies powders or aspirin products NOW-OK to take Tylenol  _x___ Stop supplements until after surgery-STOP PHENTERMINE NOW-MAY RESUME AFTER SURGERY   ____ Bring C-Pap to the hospital.

## 2017-07-30 ENCOUNTER — Ambulatory Visit: Payer: BLUE CROSS/BLUE SHIELD | Admitting: Anesthesiology

## 2017-07-30 ENCOUNTER — Encounter
Admission: RE | Disposition: A | Discharge status: Home / Self Care | Payer: Self-pay | Source: Ambulatory Visit | Attending: Obstetrics and Gynecology

## 2017-07-30 ENCOUNTER — Ambulatory Visit
Admission: RE | Admit: 2017-07-30 | Discharge: 2017-07-30 | Disposition: A | Discharge status: Home / Self Care | Payer: BLUE CROSS/BLUE SHIELD | Source: Ambulatory Visit | Attending: Obstetrics and Gynecology | Admitting: Obstetrics and Gynecology

## 2017-07-30 ENCOUNTER — Encounter: Payer: Self-pay | Admitting: Emergency Medicine

## 2017-07-30 DIAGNOSIS — Z887 Allergy status to serum and vaccine status: Secondary | ICD-10-CM | POA: Insufficient documentation

## 2017-07-30 DIAGNOSIS — N871 Moderate cervical dysplasia: Secondary | ICD-10-CM | POA: Diagnosis not present

## 2017-07-30 DIAGNOSIS — Z882 Allergy status to sulfonamides status: Secondary | ICD-10-CM | POA: Insufficient documentation

## 2017-07-30 DIAGNOSIS — D126 Benign neoplasm of colon, unspecified: Secondary | ICD-10-CM | POA: Diagnosis not present

## 2017-07-30 DIAGNOSIS — Z9889 Other specified postprocedural states: Secondary | ICD-10-CM

## 2017-07-30 DIAGNOSIS — Z79899 Other long term (current) drug therapy: Secondary | ICD-10-CM | POA: Insufficient documentation

## 2017-07-30 DIAGNOSIS — D067 Carcinoma in situ of other parts of cervix: Secondary | ICD-10-CM | POA: Diagnosis not present

## 2017-07-30 DIAGNOSIS — D061 Carcinoma in situ of exocervix: Secondary | ICD-10-CM | POA: Diagnosis not present

## 2017-07-30 DIAGNOSIS — Z888 Allergy status to other drugs, medicaments and biological substances status: Secondary | ICD-10-CM | POA: Diagnosis not present

## 2017-07-30 DIAGNOSIS — E039 Hypothyroidism, unspecified: Secondary | ICD-10-CM | POA: Diagnosis not present

## 2017-07-30 DIAGNOSIS — R87613 High grade squamous intraepithelial lesion on cytologic smear of cervix (HGSIL): Secondary | ICD-10-CM | POA: Diagnosis present

## 2017-07-30 DIAGNOSIS — D069 Carcinoma in situ of cervix, unspecified: Secondary | ICD-10-CM | POA: Diagnosis not present

## 2017-07-30 HISTORY — PX: CERVICAL BIOPSY  W/ LOOP ELECTRODE EXCISION: SUR135

## 2017-07-30 HISTORY — PX: LEEP: SHX91

## 2017-07-30 LAB — POCT PREGNANCY, URINE: Preg Test, Ur: NEGATIVE

## 2017-07-30 LAB — GLUCOSE, CAPILLARY: Glucose-Capillary: 72 mg/dL (ref 70–99)

## 2017-07-30 SURGERY — LEEP (LOOP ELECTROSURGICAL EXCISION PROCEDURE)
Anesthesia: General

## 2017-07-30 MED ORDER — PROPOFOL 10 MG/ML IV BOLUS
INTRAVENOUS | Status: DC | PRN
  Administered 2017-07-30: 200 mg via INTRAVENOUS

## 2017-07-30 MED ORDER — ACETAMINOPHEN 325 MG PO TABS
ORAL_TABLET | ORAL | Status: AC
  Filled 2017-07-30: qty 2

## 2017-07-30 MED ORDER — HYDROCODONE-ACETAMINOPHEN 7.5-325 MG PO TABS
1.0000 | ORAL_TABLET | Freq: Once | ORAL | Status: DC | PRN
  Filled 2017-07-30: qty 1

## 2017-07-30 MED ORDER — IODINE STRONG (LUGOLS) 5 % PO SOLN
ORAL | Status: DC | PRN
  Administered 2017-07-30: 8 mL

## 2017-07-30 MED ORDER — PROPOFOL 10 MG/ML IV BOLUS
INTRAVENOUS | Status: AC
  Filled 2017-07-30: qty 20

## 2017-07-30 MED ORDER — FENTANYL CITRATE (PF) 100 MCG/2ML IJ SOLN
INTRAMUSCULAR | Status: DC | PRN
  Administered 2017-07-30: 12.5 ug via INTRAVENOUS

## 2017-07-30 MED ORDER — FENTANYL CITRATE (PF) 100 MCG/2ML IJ SOLN: 25.0000 ug | INTRAMUSCULAR | Status: DC | PRN

## 2017-07-30 MED ORDER — MIDAZOLAM HCL 2 MG/2ML IJ SOLN
INTRAMUSCULAR | Status: AC
  Filled 2017-07-30: qty 2

## 2017-07-30 MED ORDER — FAMOTIDINE 20 MG PO TABS
20.0000 mg | ORAL_TABLET | Freq: Once | ORAL | Status: AC
  Administered 2017-07-30: 20 mg via ORAL

## 2017-07-30 MED ORDER — LIDOCAINE HCL (CARDIAC) PF 100 MG/5ML IV SOSY
PREFILLED_SYRINGE | INTRAVENOUS | Status: DC | PRN
  Administered 2017-07-30: 100 mg via INTRAVENOUS

## 2017-07-30 MED ORDER — LACTATED RINGERS IV SOLN
INTRAVENOUS | Status: DC
  Administered 2017-07-30: 14:00:00 via INTRAVENOUS

## 2017-07-30 MED ORDER — DEXAMETHASONE SODIUM PHOSPHATE 10 MG/ML IJ SOLN
INTRAMUSCULAR | Status: DC | PRN
  Administered 2017-07-30: 10 mg via INTRAVENOUS

## 2017-07-30 MED ORDER — PROMETHAZINE HCL 25 MG/ML IJ SOLN: 6.2500 mg | INTRAMUSCULAR | Status: DC | PRN

## 2017-07-30 MED ORDER — FAMOTIDINE 20 MG PO TABS
ORAL_TABLET | ORAL | Status: AC
  Administered 2017-07-30: 20 mg via ORAL
  Filled 2017-07-30: qty 1

## 2017-07-30 MED ORDER — PHENYLEPHRINE HCL 10 MG/ML IJ SOLN
INTRAMUSCULAR | Status: DC | PRN
  Administered 2017-07-30: 50 ug via INTRAVENOUS

## 2017-07-30 MED ORDER — IBUPROFEN 600 MG PO TABS: 600.0000 mg | ORAL_TABLET | Freq: Four times a day (QID) | ORAL | 0 refills | Status: DC | PRN

## 2017-07-30 MED ORDER — FENTANYL CITRATE (PF) 100 MCG/2ML IJ SOLN
INTRAMUSCULAR | Status: AC
  Filled 2017-07-30: qty 2

## 2017-07-30 MED ORDER — ACETAMINOPHEN 160 MG/5ML PO SOLN
325.0000 mg | ORAL | Status: DC | PRN
  Filled 2017-07-30: qty 20.3

## 2017-07-30 MED ORDER — SUCCINYLCHOLINE CHLORIDE 20 MG/ML IJ SOLN
INTRAMUSCULAR | Status: AC
  Filled 2017-07-30: qty 1

## 2017-07-30 MED ORDER — LIDOCAINE HCL (PF) 2 % IJ SOLN
INTRAMUSCULAR | Status: AC
  Filled 2017-07-30: qty 10

## 2017-07-30 MED ORDER — SEVOFLURANE IN SOLN
RESPIRATORY_TRACT | Status: AC
  Filled 2017-07-30: qty 250

## 2017-07-30 MED ORDER — MIDAZOLAM HCL 2 MG/2ML IJ SOLN
INTRAMUSCULAR | Status: DC | PRN
  Administered 2017-07-30: 2 mg via INTRAVENOUS

## 2017-07-30 MED ORDER — MEPERIDINE HCL 50 MG/ML IJ SOLN: 6.2500 mg | INTRAMUSCULAR | Status: DC | PRN

## 2017-07-30 MED ORDER — HYDROCODONE-ACETAMINOPHEN 5-325 MG PO TABS: 1.0000 | ORAL_TABLET | Freq: Four times a day (QID) | ORAL | 0 refills | Status: DC | PRN

## 2017-07-30 MED ORDER — ACETAMINOPHEN 325 MG PO TABS
325.0000 mg | ORAL_TABLET | ORAL | Status: DC | PRN
  Administered 2017-07-30: 650 mg via ORAL

## 2017-07-30 MED ORDER — ONDANSETRON HCL 4 MG/2ML IJ SOLN
INTRAMUSCULAR | Status: DC | PRN
  Administered 2017-07-30: 4 mg via INTRAVENOUS

## 2017-07-30 SURGICAL SUPPLY — 33 items
APL SWBSTK 6 STRL LF DISP (MISCELLANEOUS) ×1
APPLICATOR COTTON TIP 6 STRL (MISCELLANEOUS) ×1 IMPLANT
APPLICATOR COTTON TIP 6IN STRL (MISCELLANEOUS) ×2
CANISTER SUCT 1200ML W/VALVE (MISCELLANEOUS) ×2 IMPLANT
CATH ROBINSON RED A/P 16FR (CATHETERS) ×2 IMPLANT
CNTNR SPEC 2.5X3XGRAD LEK (MISCELLANEOUS)
CONT SPEC 4OZ STER OR WHT (MISCELLANEOUS)
CONT SPEC 4OZ STRL OR WHT (MISCELLANEOUS)
CONTAINER SPEC 2.5X3XGRAD LEK (MISCELLANEOUS) ×2 IMPLANT
ELECT LEEP BALL 5MM 12CM (MISCELLANEOUS) ×2
ELECT LEEP LOOP 20X10 R2010 (MISCELLANEOUS)
ELECT LOOP 1.0X1.0CM R1010 (MISCELLANEOUS) ×2
ELECT REM PT RETURN 9FT ADLT (ELECTROSURGICAL) ×2
ELECTRODE LEEP BALL 5MM 12CM (MISCELLANEOUS) ×1 IMPLANT
ELECTRODE LEP LOOP 20X10 R2010 (MISCELLANEOUS) ×1 IMPLANT
ELECTRODE LOOP 1.0X1.0CM R1010 (MISCELLANEOUS) ×1 IMPLANT
ELECTRODE REM PT RTRN 9FT ADLT (ELECTROSURGICAL) ×1 IMPLANT
GLOVE BIO SURGEON STRL SZ7 (GLOVE) ×2 IMPLANT
GLOVE INDICATOR 7.5 STRL GRN (GLOVE) ×2 IMPLANT
GOWN STRL REUS W/ TWL LRG LVL3 (GOWN DISPOSABLE) ×2 IMPLANT
GOWN STRL REUS W/TWL LRG LVL3 (GOWN DISPOSABLE) ×4
KIT TURNOVER CYSTO (KITS) ×2 IMPLANT
NDL SPNL 22GX3.5 QUINCKE BK (NEEDLE) ×1 IMPLANT
NEEDLE SPNL 22GX3.5 QUINCKE BK (NEEDLE) ×2 IMPLANT
NS IRRIG 500ML POUR BTL (IV SOLUTION) ×2 IMPLANT
PACK DNC HYST (MISCELLANEOUS) ×2 IMPLANT
PAD OB MATERNITY 4.3X12.25 (PERSONAL CARE ITEMS) ×2 IMPLANT
PAD PREP 24X41 OB/GYN DISP (PERSONAL CARE ITEMS) ×2 IMPLANT
PENCIL ELECTRO HAND CTR (MISCELLANEOUS) ×2 IMPLANT
STRAW SMOKE EVAC LEEP 6150 NON (MISCELLANEOUS) ×1 IMPLANT
SYR CONTROL 10ML (SYRINGE) ×2 IMPLANT
TOWEL OR 17X26 4PK STRL BLUE (TOWEL DISPOSABLE) ×1 IMPLANT
TUBING CONNECTING 10 (TUBING) ×2 IMPLANT

## 2017-07-30 NOTE — Discharge Instructions (Signed)
AMBULATORY SURGERY  °DISCHARGE INSTRUCTIONS ° ° °1) The drugs that you were given will stay in your system until tomorrow so for the next 24 hours you should not: ° °A) Drive an automobile °B) Make any legal decisions °C) Drink any alcoholic beverage ° ° °2) You may resume regular meals tomorrow.  Today it is better to start with liquids and gradually work up to solid foods. ° °You may eat anything you prefer, but it is better to start with liquids, then soup and crackers, and gradually work up to solid foods. ° ° °3) Please notify your doctor immediately if you have any unusual bleeding, trouble breathing, redness and pain at the surgery site, drainage, fever, or pain not relieved by medication. ° ° ° °4) Additional Instructions: ° ° ° ° ° ° ° °Please contact your physician with any problems or Same Day Surgery at [phone], Monday through Friday 6 am to 4 pm, or [place] [facility] [place] Main number at [phone]. °

## 2017-07-30 NOTE — Op Note (Signed)
Preoperative Diagnosis: 1) 52 y.o. with high grade dysplasia on pap  Postoperative Diagnosis: 1) 52 y.o. with high grade dysplasia on pap  Operation Performed: Loop Electrocautery Excision Procedure  Indication: 52 y.o. [id number]  with high grade pap high risk HPV 1 year ago, lost to follow up with pap this year showing ASC-H also high risk HPV positive.  06/21/2015 HSIL pap with negative colposcopy.  Surgeon: Vena AustriaAndreas Charlee Whitebread, MD  Anesthesia: Choice  Preoperative Antibiotics: none  Estimated Blood Loss: 5 mL  IV Fluids: 500mL  Urine Output:: 300mL  Drains or Tubes: none  Implants: none  Specimens Removed: Ectocervix, right and left ectocervical margins, endocervical biopsy, and ECC  Complications: none  Intraoperative Findings: Large non-staining area on Lugol's applicatioin primarily inferior cervix  Physical Exam  Genitourinary:      Patient Condition: stable  Procedure in Detail:  Patient was taken to the operating room where she was administered general anesthesia.  She was positioned in the dorsal lithotomy position utilizing Allen stirups, prepped and draped in the usual sterile fashion.  Prior to proceeding with procedure a time out was performed.  Attention was turned to the patient's pelvis.  A red rubber catheter was used to empty the patient's bladder.  An operative speculum was placed to allow visualization of the cervix.  Lugol's was applied to the cervix noting a large non-staining area involving the inferior ectocervix.  1% Xylocaine with epinephrine was administered.  A large loop electrode was used to obtain the ectocervical biopsy.  There were additional non-staining borders past the margin so additional biopsies were taken right and left to remove these non-staining sites, with a tongue depressor used to retract the vaginal sidewalls.  A small loop was used to obtain the endocervical biopsy, followed by an ECC to obtain the upper margin of the specimen.  A  rollerball was used to ensure hemostasis and treat around the specimen margins. The operative speculum was removed.  The patient tolerated the procedure well and was taken to the recovery room in stable condition.

## 2017-07-30 NOTE — Progress Notes (Signed)
Date of Initial H&P: 07/22/2017  History reviewed, patient examined, no change in status, stable for surgery.

## 2017-07-30 NOTE — Anesthesia Post-op Follow-up Note (Signed)
Anesthesia QCDR form completed.        

## 2017-07-30 NOTE — Anesthesia Preprocedure Evaluation (Signed)
Anesthesia Evaluation  Patient identified by MRN, date of birth, ID band Patient awake    Reviewed: Allergy & Precautions, H&P , NPO status , reviewed documented beta blocker date and time   History of Anesthesia Complications (+) PONV, Family history of anesthesia reaction and history of anesthetic complications  Airway Mallampati: III  TM Distance: >3 FB Neck ROM: full    Dental  (+) Chipped   Pulmonary    Pulmonary exam normal        Cardiovascular Normal cardiovascular exam     Neuro/Psych  Headaches,  Neuromuscular disease    GI/Hepatic neg GERD  ,  Endo/Other  Hypothyroidism   Renal/GU      Musculoskeletal   Abdominal   Peds  Hematology   Anesthesia Other Findings Past Medical History: No date: Complication of anesthesia No date: Family history of adverse reaction to anesthesia     Comment:  PT IS ADOPTED No date: Frequent UTI     Comment:  in past-with uro w/u and urethral dilation 06/1998: Guillain-Barre (HCC)     Comment:  Guillain-Barre Plasmaphoresis- [facility] No date: Headache     Comment:  MIGRAINES No date: PONV (postoperative nausea and vomiting) Past Surgical History: 1985: TONSILLECTOMY BMI    Body Mass Index:  34.28 kg/m     Reproductive/Obstetrics                             Anesthesia Physical Anesthesia Plan  ASA: II  Anesthesia Plan: General LMA   Post-op Pain Management:    Induction:   PONV Risk Score and Plan: 4 or greater and Ondansetron, Treatment may vary due to age or medical condition, Midazolam, Dexamethasone and Metaclopromide  Airway Management Planned:   Additional Equipment:   Intra-op Plan:   Post-operative Plan:   Informed Consent: I have reviewed the patients History and Physical, chart, labs and discussed the procedure including the risks, benefits and alternatives for the proposed anesthesia  with the patient or authorized representative who has indicated his/her understanding and acceptance.   Dental Advisory Given  Plan Discussed with: CRNA  Anesthesia Plan Comments:         Anesthesia Quick Evaluation

## 2017-07-30 NOTE — Transfer of Care (Signed)
Immediate Anesthesia Transfer of Care Note  Patient: Sherry Mendoza  Procedure(s) Performed: LOOP ELECTROSURGICAL EXCISION PROCEDURE (LEEP) (N/A )  Patient Location: PACU  Anesthesia Type:General  Level of Consciousness: sedated  Airway & Oxygen Therapy: Patient connected to face mask oxygen  Post-op Assessment: Post -op Vital signs reviewed and stable  Post vital signs: stable  Last Vitals:  Vitals Value Taken Time  BP    Temp    Pulse    Resp    SpO2      Last Pain:  Vitals:   07/30/17 1325  TempSrc: Temporal  PainSc: 5          Complications: No apparent anesthesia complications

## 2017-07-30 NOTE — Anesthesia Postprocedure Evaluation (Signed)
Anesthesia Post Note  Patient: Sherry Mendoza  Procedure(s) Performed: LOOP ELECTROSURGICAL EXCISION PROCEDURE (LEEP) (N/A )  Patient location during evaluation: PACU Anesthesia Type: General Level of consciousness: awake and alert Pain management: pain level controlled Vital Signs Assessment: post-procedure vital signs reviewed and stable Respiratory status: spontaneous breathing, nonlabored ventilation, respiratory function stable and patient connected to nasal cannula oxygen Cardiovascular status: blood pressure returned to baseline and stable Postop Assessment: no apparent nausea or vomiting Anesthetic complications: no     Last Vitals:  Vitals:   07/30/17 1715 07/30/17 1722  BP:  136/84  Pulse: 76 74  Resp: 14 16  Temp: 36.4 C (!) 36.2 C  SpO2: 99% 99%    Last Pain:  Vitals:   07/30/17 1722  TempSrc: Temporal  PainSc: 3                  Lenard SimmerAndrew Isaah Furry

## 2017-07-30 NOTE — Anesthesia Procedure Notes (Signed)
Procedure Name: LMA Insertion Date/Time: 07/30/2017 4:08 PM Performed by: Irving BurtonBachich, Amair Shrout, CRNA Pre-anesthesia Checklist: Patient identified, Emergency Drugs available, Suction available and Patient being monitored Patient Re-evaluated:Patient Re-evaluated prior to induction Oxygen Delivery Method: Circle system utilized Preoxygenation: Pre-oxygenation with 100% oxygen Induction Type: IV induction Ventilation: Mask ventilation without difficulty LMA: LMA inserted LMA Size: 4.0 Number of attempts: 1 Placement Confirmation: positive ETCO2 and breath sounds checked- equal and bilateral Tube secured with: Tape Dental Injury: Teeth and Oropharynx as per pre-operative assessment

## 2017-07-31 ENCOUNTER — Encounter: Payer: Self-pay | Admitting: Obstetrics and Gynecology

## 2017-08-01 NOTE — H&P (Signed)
Date of Initial H&P: 07/22/17  History reviewed, patient examined, no change in status, stable for surgery.

## 2017-08-02 ENCOUNTER — Telehealth: Payer: BLUE CROSS/BLUE SHIELD | Admitting: Physician Assistant

## 2017-08-02 DIAGNOSIS — R300 Dysuria: Secondary | ICD-10-CM

## 2017-08-02 MED ORDER — CEPHALEXIN 500 MG PO CAPS: 500.0000 mg | ORAL_CAPSULE | Freq: Two times a day (BID) | ORAL | 0 refills | Status: AC

## 2017-08-02 NOTE — Progress Notes (Signed)
We are sorry that you are not feeling well.  Here is how we plan to help!  Based on what you shared with me it looks like you most likely have a simple urinary tract infection.  A UTI (Urinary Tract Infection) is a bacterial infection of the bladder.  Most cases of urinary tract infections are simple to treat but a key part of your care is to encourage you to drink plenty of fluids and watch your symptoms carefully. I do want you to call your OB/GYN tomorrow, giving recent surgical procedure, to make sure they do not think a further assessment is needed.  I have prescribed Keflex 500 mg twice a day for 7 days.  Your symptoms should gradually improve. Call us if the burning in your urine worsens, you develop worsening fever, back pain or pelvic pain or if your symptoms do not resolve after completing the antibiotic.  Urinary tract infections can be prevented by drinking plenty of water to keep your body hydrated.  Also be sure when you wipe, wipe from front to back and don't hold it in!  If possible, empty your bladder every 4 hours.  Your e-visit answers were reviewed by a board certified advanced clinical practitioner to complete your personal care plan.  Depending on the condition, your plan could have included both over the counter or prescription medications.  If there is a problem please reply  once you have received a response from your provider.  Your safety is important to us.  If you have drug allergies check your prescription carefully.    You can use MyChart to ask questions about today's visit, request a non-urgent call back, or ask for a work or school excuse for 24 hours related to this e-Visit. If it has been greater than 24 hours you will need to follow up with your provider, or enter a new e-Visit to address those concerns.   You will get an e-mail in the next two days asking about your experience.  I hope that your e-visit has been valuable and will speed your recovery. Thank  you for using e-visits.

## 2017-08-04 LAB — SURGICAL PATHOLOGY

## 2017-08-13 ENCOUNTER — Ambulatory Visit (INDEPENDENT_AMBULATORY_CARE_PROVIDER_SITE_OTHER): Payer: BLUE CROSS/BLUE SHIELD | Admitting: Obstetrics and Gynecology

## 2017-08-13 ENCOUNTER — Other Ambulatory Visit: Payer: Self-pay | Admitting: Family Medicine

## 2017-08-13 ENCOUNTER — Encounter: Payer: Self-pay | Admitting: Obstetrics and Gynecology

## 2017-08-13 VITALS — BP 126/88 | HR 84 | Ht 65.0 in | Wt 200.0 lb

## 2017-08-13 DIAGNOSIS — Z4889 Encounter for other specified surgical aftercare: Secondary | ICD-10-CM

## 2017-08-13 NOTE — Progress Notes (Signed)
Postoperative Follow-up Patient presents post op from LEEP 2weeks ago for cervical dysplasia on pap (CIN III on LEEP).  Subjective: Patient reports marked improvement in her preop symptoms. Eating a regular diet without difficulty. The patient is not having any pain.  Activity: normal activities of daily living.  Objective: Blood pressure 126/88, pulse 84, height 5\' 5"  (1.651 m), weight 200 lb (90.7 kg), last menstrual period 07/22/2017.  Gen: NAD HEENT: normocephalic, anicteric Pulmonary: no increased work of breathing Ext: gravid, non-tender  Admission on 07/30/2017, Discharged on 07/30/2017  Component Date Value Ref Range Status  . Preg Test, Ur 07/30/2017 NEGATIVE  NEGATIVE Final   Comment:        THE SENSITIVITY OF THIS METHODOLOGY IS >24 mIU/mL   . Glucose-Capillary 07/30/2017 72  70 - 99 mg/dL Final  . SURGICAL PATHOLOGY 07/30/2017    Final                   Value:Surgical Pathology CASE: [id number] PATIENT: [facility] Surgical Pathology Report     SPECIMEN SUBMITTED: A. Ectocervix margin,right B. Ectocervix margin,left C. Ectocerix, central D. Endocervix E. Endocervical curettings  CLINICAL HISTORY: None provided  PRE-OPERATIVE DIAGNOSIS: High grade dysplasia  POST-OPERATIVE DIAGNOSIS: Same as pre op     DIAGNOSIS: A. RIGHT ECTOCERVIX MARGIN; LEEP: - FOCAL HIGH-GRADE SQUAMOUS INTRAEPITHELIAL LESION (HSIL, CIN 3). - ECTOCERVICAL AND ENDOCERVICAL MARGINS ARE NEGATIVE FOR INTRAEPITHELIAL NEOPLASIA.  B. LEFT ECTOCERVIX MARGIN; LEEP: - HIGH-GRADE SQUAMOUS INTRAEPITHELIAL LESION (HSIL, CIN 3), SEE COMMENT.  C. CENTRAL ECTOCERVIX; LEEP: - HIGH-GRADE SQUAMOUS INTRAEPITHELIAL LESION (HSIL, CIN 3). - ECTOCERVICAL MARGIN IS POSITIVE FOR HSIL ALONG THE INFERIOR ASPECT. - ENDOCERVICAL MARGIN IS NEGATIVE FOR INTRAEPITHELIAL NEOPLASIA.  D. ENDOCERVIX; LEEP: - NEGATIVE FOR INTRAEPITHELIAL NEOPLASIA. - MICROGLANDU                         LAR  HYPERPLASIA.  E. ENDOCERVIX; CURETTAGE: - BENIGN ENDOCERVICAL AND ENDOMETRIAL TISSUE.  Comment: The left ectocervical margin (B) was inked and sectioned into 7 pieces, and 4 consecutive pieces are entirely surfaced by HSIL. Two pieces at one end are surfaced by squamous mucosa without HSIL, but no endocervical mucosa is identified. With the involvement including both inked aspects of the tissue, a positive ectocervical margin appears likely.   GROSS DESCRIPTION: A. Labeled: Right ectocervix margin Received: In formalin Size: 2.8 x 1.4 x 0.6 cm Ectocervix: Smooth tan with focal ecchymosis Endocervix: Possible focal endocervical aspect on the concave side Inking scheme: Concave side marked black and convex side marked blue. Sectioned and submitted sequentially.  Block summary: 1-2 - entirely submitted  B. Labeled: Left ectocervix margin Received: In formalin Size: 1.8 x 1.3 x 0.5 cm Ectocervix: Purple to tan Endocervix: None identified Inking scheme: Co                         nvex side inked blue and concave side black. Sectioned and submitted sequentially.  Block summary: 1-2 - entirely submitted  C. Labeled: Central ectocervix Received: In formalin Size: 2.8 x 1.9 by up to 1 cm with an eccentric 0.5 cm slit like os cm Ectocervix: Mottled purple to tan Endocervix: Pink-tan Inking scheme: Deep endocervical edge marked black and remaining margin blue  Block summary: 1-4 - entirely submitted clockwise sequentially  D. Labeled: Endocervix Received: In formalin Size: 2.3 x 0.8 x 0.6 cm Ectocervix: None positively identified Endocervix: Pink mucosa Inking  scheme: Specimen butterfly-shaped and cannot be oriented, entirely inked blue  Block summary: 1-2 - submitted sequentially  E. Labeled: Endocervical curettings Received: In formalin Tissue fragment(s): Multiple Size: aggregate, 1.6 x 0.6 x 0.1cm Description: Mucoid and brown material Entirely submitted in  one cassette.   Final Diagnosis performed by Ronald LoboMary Olney, MD.   Electron                         ically signed 08/04/2017 1:35:53PM The electronic signature indicates that the named Attending Pathologist has evaluated the specimen  Technical component performed at [facility], [address], [place], [place] [address] Lab: [phone] Dir: Jolene SchimkeSanjai Nagendra, MD, MMM  Professional component performed at [facility], [facility], [address], [place], [place] [address] Lab: [phone] Dir: Georgiann Cockerara C. Rubinas, MD     Assessment: 52 y.o. s/p LEEP stable  Plan: Patient has done well after surgery with no apparent complications.  I have discussed the post-operative course to date, and the expected progress moving forward.  The patient understands what complications to be concerned about.  I will see the patient in routine follow up, or sooner if needed.    We discussed that 80% of the population will have exposure to human papilloma virus (HPV) during their lifetime.  HPV is a large group of viruses, and are also the causative virus for common warts and genital warts.  The pap smear tests for 13 high risk HPV strains that have some association with cervical cancer, but do not cause visual lesion such as warts.  HPV type 16 and 18 have the highest association with cervical cancer.  The vast majority of HPV infections will be uncomplicated at clear spontaneously in 12-18 months in non-immunocompromised patients.  Patient with compromised immune systems, those taking immunosuppressive drugs, or smoker have shown to have a lower clearance rate and higher persistence of HPV infection.    Currently there are no FDA approved treatments to promote HPV clearance.  Gardasil vaccination is available to prevent HPV infection, but this is only beneficial pre-exposure.  Abstaining from intercourse will not increase clearance  Lastly we stressed that if properly followed HPV should not lead to cervical  cancer.   The goal of screening is to identify patient who develop precancerous lesions of the cervix and treat these prior to progression to frank cervical cancer.  The incidence of cervical cancer is 7 cases per 100,000 women in the [place] a year.  This relatively low rate is in part due to universal screening as well as vaccination efforts.     Activity plan: pelvic rest  Plan on repeat pap in 6 months given margin status  Return in about 1 month (around 09/10/2017) for postop.    Vena AustriaAndreas Romonda Parker, MD, Merlinda FrederickFACOG [facility], [facility] 08/13/2017, 9:44 AM

## 2017-08-14 NOTE — Telephone Encounter (Signed)
Electronic refill request. Imitrex Last office visit:   04/22/17 Acute Dr. Reece AgarG Last Filled:    10 tablet 1 05/14/2016  Please advise.

## 2017-08-16 NOTE — Telephone Encounter (Signed)
Sent. Thanks.  I realize she just had a LEEP, but needs CPE when possible, ie this fall.  Thanks.

## 2017-08-17 NOTE — Telephone Encounter (Signed)
Patient advised.

## 2017-09-04 ENCOUNTER — Other Ambulatory Visit: Payer: Self-pay | Admitting: Obstetrics and Gynecology

## 2017-09-04 NOTE — Telephone Encounter (Signed)
Please advise, patient has not been seen since 07/2017. Upcoming Postop appointment

## 2017-09-11 ENCOUNTER — Encounter: Payer: Self-pay | Admitting: Obstetrics and Gynecology

## 2017-09-11 ENCOUNTER — Ambulatory Visit (INDEPENDENT_AMBULATORY_CARE_PROVIDER_SITE_OTHER): Payer: BLUE CROSS/BLUE SHIELD | Admitting: Obstetrics and Gynecology

## 2017-09-11 VITALS — BP 120/78 | HR 75 | Ht 65.0 in | Wt 198.0 lb

## 2017-09-11 DIAGNOSIS — D069 Carcinoma in situ of cervix, unspecified: Secondary | ICD-10-CM

## 2017-09-14 DIAGNOSIS — D069 Carcinoma in situ of cervix, unspecified: Secondary | ICD-10-CM | POA: Insufficient documentation

## 2017-09-14 NOTE — Progress Notes (Signed)
Postoperative Follow-up Patient presents post op from LEEP 6weeks ago for CIN III.  Subjective: Patient reports marked improvement in her preop symptoms. Eating a regular diet without difficulty. The patient is not having any pain.  Activity: normal activities of daily living.  Objective: Blood pressure 120/78, pulse 75, height 5\' 5"  (1.651 m), weight 198 lb (89.8 kg).  Gen: NAD HEENT: normocephalic, anicteric Pulmonary: no increased work of breathing GU: normal external female genitalia, normal vaginal mucosa, cervix with some continued granulation tissue but otherwise healing well  Admission on 07/30/2017, Discharged on 07/30/2017  Component Date Value Ref Range Status  . Preg Test, Ur 07/30/2017 NEGATIVE  NEGATIVE Final   Comment:        THE SENSITIVITY OF THIS METHODOLOGY IS >24 mIU/mL   . Glucose-Capillary 07/30/2017 72  70 - 99 mg/dL Final  . SURGICAL PATHOLOGY 07/30/2017    Final                   Value:Surgical Pathology CASE: [id number] [facility]     SPECIMEN SUBMITTED: A. Ectocervix margin,right B. Ectocervix margin,left C. Ectocerix, central D. Endocervix E. Endocervical curettings  CLINICAL HISTORY: None provided  PRE-OPERATIVE DIAGNOSIS: High grade dysplasia  POST-OPERATIVE DIAGNOSIS: Same as pre op     DIAGNOSIS: A. RIGHT ECTOCERVIX MARGIN; LEEP: - FOCAL HIGH-GRADE SQUAMOUS INTRAEPITHELIAL LESION (HSIL, CIN 3). - ECTOCERVICAL AND ENDOCERVICAL MARGINS ARE NEGATIVE FOR INTRAEPITHELIAL NEOPLASIA.  B. LEFT ECTOCERVIX MARGIN; LEEP: - HIGH-GRADE SQUAMOUS INTRAEPITHELIAL LESION (HSIL, CIN 3), SEE COMMENT.  C. CENTRAL ECTOCERVIX; LEEP: - HIGH-GRADE SQUAMOUS INTRAEPITHELIAL LESION (HSIL, CIN 3). - ECTOCERVICAL MARGIN IS POSITIVE FOR HSIL ALONG THE INFERIOR ASPECT. - ENDOCERVICAL MARGIN IS NEGATIVE FOR INTRAEPITHELIAL NEOPLASIA.  D. ENDOCERVIX; LEEP: - NEGATIVE FOR INTRAEPITHELIAL NEOPLASIA. -  MICROGLANDU                         LAR HYPERPLASIA.  E. ENDOCERVIX; CURETTAGE: - BENIGN ENDOCERVICAL AND ENDOMETRIAL TISSUE.  Comment: The left ectocervical margin (B) was inked and sectioned into 7 pieces, and 4 consecutive pieces are entirely surfaced by HSIL. Two pieces at one end are surfaced by squamous mucosa without HSIL, but no endocervical mucosa is identified. With the involvement including both inked aspects of the tissue, a positive ectocervical margin appears likely.   GROSS DESCRIPTION: A. Labeled: Right ectocervix margin Received: In formalin Size: 2.8 x 1.4 x 0.6 cm Ectocervix: Smooth tan with focal ecchymosis Endocervix: Possible focal endocervical aspect on the concave side Inking scheme: Concave side marked black and convex side marked blue. Sectioned and submitted sequentially.  Block summary: 1-2 - entirely submitted  B. Labeled: Left ectocervix margin Received: In formalin Size: 1.8 x 1.3 x 0.5 cm Ectocervix: Purple to tan Endocervix: None identified Inking scheme: Co                         nvex side inked blue and concave side black. Sectioned and submitted sequentially.  Block summary: 1-2 - entirely submitted  C. Labeled: Central ectocervix Received: In formalin Size: 2.8 x 1.9 by up to 1 cm with an eccentric 0.5 cm slit like os cm Ectocervix: Mottled purple to tan Endocervix: Pink-tan Inking scheme: Deep endocervical edge marked black and remaining margin blue  Block summary: 1-4 - entirely submitted clockwise sequentially  D. Labeled: Endocervix Received: In formalin Size: 2.3 x 0.8 x 0.6 cm Ectocervix: None positively  identified Endocervix: Pink mucosa Inking scheme: Specimen butterfly-shaped and cannot be oriented, entirely inked blue  Block summary: 1-2 - submitted sequentially  E. Labeled: Endocervical curettings Received: In formalin Tissue fragment(s): Multiple Size: aggregate, 1.6 x 0.6 x 0.1cm Description: Mucoid  and brown material Entirely submitted in one cassette.   Final Diagnosis performed by Ronald LoboMary Olney, MD.   Electron                         ically signed 08/04/2017 1:35:53PM The electronic signature indicates that the named Attending Pathologist has evaluated the specimen  Technical component performed at [facility], [address], [place], [place] [address] Lab: [phone] Dir: Jolene SchimkeSanjai Nagendra, MD, MMM  Professional component performed at [facility], [facility], [address], [place], [place] [address] Lab: [phone] Dir: Georgiann Cockerara C. Rubinas, MD     Assessment: 52 y.o. s/p LEEP stable  Plan: Patient has done well after surgery with no apparent complications.  I have discussed the post-operative course to date, and the expected progress moving forward.  The patient understands what complications to be concerned about.  I will see the patient in routine follow up, or sooner if needed.    Activity plan: No restriction.  Follow up in 6 months for follow up pap given positive ectocervical margin  Return in about 6 months (around 03/14/2018) for follow up pap.    Vena AustriaAndreas Kameah Rawl, MD, Evern CoreFACOG [facility], [facility] 09/14/2017, 9:32 AM

## 2017-09-28 DIAGNOSIS — H524 Presbyopia: Secondary | ICD-10-CM | POA: Diagnosis not present

## 2017-12-18 ENCOUNTER — Telehealth: Payer: BLUE CROSS/BLUE SHIELD | Admitting: Family

## 2017-12-18 DIAGNOSIS — J028 Acute pharyngitis due to other specified organisms: Secondary | ICD-10-CM

## 2017-12-18 DIAGNOSIS — B9689 Other specified bacterial agents as the cause of diseases classified elsewhere: Secondary | ICD-10-CM

## 2017-12-18 MED ORDER — BENZONATATE 100 MG PO CAPS: 100.0000 mg | ORAL_CAPSULE | Freq: Three times a day (TID) | ORAL | 0 refills | Status: DC | PRN

## 2017-12-18 MED ORDER — AZITHROMYCIN 250 MG PO TABS: ORAL_TABLET | ORAL | 0 refills | Status: DC

## 2017-12-18 NOTE — Progress Notes (Signed)

## 2018-01-29 ENCOUNTER — Other Ambulatory Visit: Payer: Self-pay | Admitting: Family Medicine

## 2018-01-29 NOTE — Telephone Encounter (Signed)
Electronic refill request. Imitrex Last office visit:   04/22/17 Acute Last Filled:    10 tablet 1 05/14/2016  Please advise.

## 2018-02-03 NOTE — Telephone Encounter (Signed)
Sent. Needs CPE when possible.  Thanks.  

## 2018-02-04 ENCOUNTER — Encounter: Payer: Self-pay | Admitting: *Deleted

## 2018-02-04 NOTE — Telephone Encounter (Signed)
[facility] faxed note requesting refill Imitrex. Per DPR left v/m on cell # that imitrex rx was sent to [facility] on 02/03/18.

## 2018-02-04 NOTE — Telephone Encounter (Signed)
Letter mailed

## 2018-02-08 ENCOUNTER — Encounter: Payer: Self-pay | Admitting: Family Medicine

## 2018-02-08 ENCOUNTER — Ambulatory Visit: Payer: BLUE CROSS/BLUE SHIELD | Admitting: Family Medicine

## 2018-02-08 DIAGNOSIS — G43009 Migraine without aura, not intractable, without status migrainosus: Secondary | ICD-10-CM | POA: Diagnosis not present

## 2018-02-08 MED ORDER — SUMATRIPTAN SUCCINATE 100 MG PO TABS: ORAL_TABLET | ORAL | 3 refills | Status: DC

## 2018-02-08 NOTE — Progress Notes (Signed)
Migraines w/o aura.  imitrex helps.  Unilateral throbbing pain usually behind the R eye, when she has an episode.  Photophobia, phonophobia.  She is having less per month now and less severe, usually ~4 per month, usually clustered in a week.  No ADE on med.    She is working on diet and exercise.  D/w pt.  She is cutting back on caffeine.    She has gyn f/u pending for next month.    Meds, vitals, and allergies reviewed.   ROS: Per HPI unless specifically indicated in ROS section   GEN: nad, alert and oriented HEENT: mucous membranes moist NECK: supple w/o LA CV: rrr.  PULM: ctab, no inc wob ABD: soft, +bs EXT: no edema SKIN: no acute rash

## 2018-02-08 NOTE — Patient Instructions (Signed)
Keep working on diet and exercise.  Schedule a physical for the summer after you have had follow up with the gynecology clinic.  Use imitrex and needed.  Update me as needed.  Try to slowly taper caffeine.  Take care.  Glad to see you.

## 2018-02-10 NOTE — Assessment & Plan Note (Signed)
Keep working on diet and exercise.  Schedule a physical for the summer after follow up with the gynecology clinic.  Use imitrex and needed.  Update me as needed.  Try to slowly taper caffeine.  She agrees with plan.

## 2018-03-15 ENCOUNTER — Encounter: Payer: Self-pay | Admitting: Obstetrics and Gynecology

## 2018-03-15 ENCOUNTER — Ambulatory Visit (INDEPENDENT_AMBULATORY_CARE_PROVIDER_SITE_OTHER): Payer: BLUE CROSS/BLUE SHIELD | Admitting: Obstetrics and Gynecology

## 2018-03-15 ENCOUNTER — Other Ambulatory Visit (HOSPITAL_COMMUNITY)
Admission: RE | Admit: 2018-03-15 | Discharge: 2018-03-15 | Disposition: A | Discharge status: Home / Self Care | Payer: BLUE CROSS/BLUE SHIELD | Source: Ambulatory Visit | Attending: Obstetrics and Gynecology | Admitting: Obstetrics and Gynecology

## 2018-03-15 VITALS — BP 124/82 | HR 72 | Ht 65.0 in | Wt 212.0 lb

## 2018-03-15 DIAGNOSIS — D069 Carcinoma in situ of cervix, unspecified: Secondary | ICD-10-CM | POA: Diagnosis not present

## 2018-03-15 NOTE — Progress Notes (Signed)
Obstetrics & Gynecology Office Visit   Chief Complaint:  Chief Complaint  Patient presents with  . Follow-up    Pap    History of Present Illness:Sherry Mendoza is a 53 y.o. woman who presents today for continued surveillance for history of dysplasia. Last pap obtained  06/25/2017 [facility].  Prior LEEP CIN III and positive margins    She has not had a menstrual cycle since her LEEP procedure  Pap/Treatment History:  07/30/2017 LEEP CIN III with positive ectocervical margin, endocervix and ECC specimen negative 06/25/2017 [facility]  06/23/2016 [facility] pap with negative colposcopy  Review of Systems: Review of Systems  All other systems reviewed and are negative.    Past Medical History:  Past Medical History:  Diagnosis Date  . Complication of anesthesia   . Family history of adverse reaction to anesthesia    PT IS ADOPTED  . Frequent UTI    in past-with uro w/u and urethral dilation  . Guillain-Barre (HCC) 06/1998   Guillain-Barre Plasmaphoresis- hospitalized x 1 week at [facility]  . Headache    MIGRAINES  . PONV (postoperative nausea and vomiting)     Past Surgical History:  Past Surgical History:  Procedure Laterality Date  . CERVICAL BIOPSY  W/ LOOP ELECTRODE EXCISION  07/30/2017  . LEEP N/A 07/30/2017   Procedure: LOOP ELECTROSURGICAL EXCISION PROCEDURE (LEEP);  Surgeon: Vena Austria, MD;  Location: [facility] ORS;  Service: Gynecology;  Laterality: N/A;  . TONSILLECTOMY  1985    Gynecologic History: No LMP recorded. (Menstrual status: Perimenopausal).  Obstetric History: [id number]  Family History:  Family History  Adopted: Yes  Family history unknown: Yes    Social History:  Social History   Socioeconomic History  . Marital status: Married    Spouse name: Not on file  . Number of children: Not on file  . Years of education: Not on file  . Highest education level: Not on file  Occupational History  . Occupation: Housewife/Teacher    Employer: [employer]  Social  Needs  . Financial resource strain: Not on file  . Food insecurity:    Worry: Not on file    Inability: Not on file  . Transportation needs:    Medical: Not on file    Non-medical: Not on file  Tobacco Use  . Smoking status: Never Smoker  . Smokeless tobacco: Never Used  Substance and Sexual Activity  . Alcohol use: No    Alcohol/week: 0.0 standard drinks  . Drug use: No  . Sexual activity: Yes    Birth control/protection: None  Lifestyle  . Physical activity:    Days per week: 2 days    Minutes per session: 30 min  . Stress: Not at all  Relationships  . Social connections:    Talks on phone: Not on file    Gets together: Not on file    Attends religious service: Not on file    Active member of club or organization: Not on file    Attends meetings of clubs or organizations: Not on file    Relationship status: Not on file  . Intimate partner violence:    Fear of current or ex partner: Not on file    Emotionally abused: Not on file    Physically abused: Not on file    Forced sexual activity: Not on file  Other Topics Concern  . Not on file  Social History Narrative   Unknown family history-adopted   Married 1991- lives with  husband   2 children   Housewife-teaching at [employer]    Allergies:  Allergies  Allergen Reactions  . Epinephrine Other (See Comments)    TACHYCARDIA EVEN WITH DOSAGES GIVEN IN DENTIST OFFICE  . Influenza Vaccines     H/o gillian barre syndrome  . Nitrofurantoin Other (See Comments)    ELEVATED LIVER FUNCTIONS  . Sulfonamide Derivatives Itching    Medications: Prior to Admission medications   Medication Sig Start Date End Date Taking? Authorizing Provider  Rhubarb (ESTROVEN MENOPAUSE RELIEF PO) Take by mouth.   Yes [provider]  SUMAtriptan (IMITREX) 100 MG tablet TAKE 1 TABLET BY MOUTH AT ONSET OF HEADACHE. MAY REPEAT IN 2 HOURS IF NEEDED (MAX 2 DOSES IN 24HRS) 02/08/18  Yes Joaquim Nam, MD     Physical Exam Vitals:  Vitals:   03/15/18 0832  BP: 124/82  Pulse: 72   No LMP recorded. (Menstrual status: Perimenopausal).  General: NAD HEENT: normocephalic, anicteric Thyroid: no enlargement, no palpable nodules Pulmonary: No increased work of breathing Genitourinary:  External: Normal external female genitalia.  Normal urethral meatus, normal  Bartholin's and Skene's glands.    Vagina: Normal vaginal mucosa, no evidence of prolapse.    Cervix: Grossly normal in appearance, no bleeding  Uterus: Non-enlarged, mobile, normal contour.  No CMT  Adnexa: ovaries non-enlarged, no adnexal masses  Rectal: deferred  Lymphatic: no evidence of inguinal lymphadenopathy Extremities: no edema, erythema, or tenderness Neurologic: Grossly intact Psychiatric: mood appropriate, affect full  Female chaperone present for pelvic and breast  portions of the physical exam    Assessment: 53 y.o. [id number] follow up for CIN III with positive ectocervical margin  Plan: Problem List Items Addressed This Visit    None    Visit Diagnoses    CIN III with severe dysplasia    -  Primary   Relevant Orders   Cytology, thin prep pap (cervical)      - Follow up pap smear from today.   - I had a lengthly discussion with Sherry Mendoza  regarding the cause of dysplasia of the lower genital tract (including immunosuppression in the setting of HPV exposure and tobacco exposure). I explained the potential for progression to invasive malignancy, the recurrent nature of these lesions (and the need for close continued followup). Results of today's pap will dictate need for further evaluation and follow up per ASCCP guidelines..  - She is comfortable with the plan and had her questions answered.  - Return in about 6 months (around 09/13/2018) for Annual.   Vena Austria, MD, [name], [facility] 03/15/2018, 8:57 AM

## 2018-03-17 LAB — CYTOLOGY - PAP
Adequacy: ABSENT
Diagnosis: NEGATIVE
HPV: NOT DETECTED

## 2018-04-05 ENCOUNTER — Encounter: Payer: Self-pay | Admitting: Obstetrics and Gynecology

## 2018-04-05 ENCOUNTER — Ambulatory Visit (INDEPENDENT_AMBULATORY_CARE_PROVIDER_SITE_OTHER): Payer: BLUE CROSS/BLUE SHIELD | Admitting: Obstetrics and Gynecology

## 2018-04-05 VITALS — BP 130/90 | HR 70 | Wt 214.0 lb

## 2018-04-05 DIAGNOSIS — R102 Pelvic and perineal pain: Secondary | ICD-10-CM | POA: Diagnosis not present

## 2018-04-05 LAB — POCT URINALYSIS DIPSTICK
Bilirubin, UA: NEGATIVE
Blood, UA: NEGATIVE
Glucose, UA: NEGATIVE
Ketones, UA: NEGATIVE
Leukocytes, UA: NEGATIVE
Nitrite, UA: NEGATIVE
Protein, UA: NEGATIVE
Spec Grav, UA: 1.01 (ref 1.010–1.025)
Urobilinogen, UA: NEGATIVE E.U./dL — AB
pH, UA: 5 (ref 5.0–8.0)

## 2018-04-06 LAB — CBC
Hematocrit: 38.5 % (ref 34.0–46.6)
Hemoglobin: 12.9 g/dL (ref 11.1–15.9)
MCH: 28.8 pg (ref 26.6–33.0)
MCHC: 33.5 g/dL (ref 31.5–35.7)
MCV: 86 fL (ref 79–97)
Platelets: 318 10*3/uL (ref 150–450)
RBC: 4.48 x10E6/uL (ref 3.77–5.28)
RDW: 12.2 % (ref 11.7–15.4)
WBC: 8.8 10*3/uL (ref 3.4–10.8)

## 2018-04-07 ENCOUNTER — Other Ambulatory Visit: Payer: Self-pay | Admitting: Obstetrics and Gynecology

## 2018-04-07 DIAGNOSIS — R102 Pelvic and perineal pain: Secondary | ICD-10-CM

## 2018-04-12 ENCOUNTER — Ambulatory Visit: Payer: BLUE CROSS/BLUE SHIELD

## 2018-04-16 NOTE — Progress Notes (Signed)
Obstetrics & Gynecology Office Visit   Chief Complaint:  Chief Complaint  Patient presents with   Low pelvic pain    right side/low back pain x1 week    History of Present Illness: 53 year old caucasian female with pelvic pain and right sided lower back pain onset approximately 1 week ago. Quality sharp, cramping.  Pain is constant with intermittent exacerbations.  Does not come and go in waves.  No nausea, emesis, fevers, chills, constipation, diarrhea, hematuria.  She was wondering if symptoms could be related to PMS or UTI.    No vaginal bleeding concerns. Pap smear is up to date 03/15/2018 and normal.    Review of Systems: Review of Systems  Constitutional: Negative.   Gastrointestinal: Positive for abdominal pain. Negative for constipation, diarrhea, nausea and vomiting.  Genitourinary: Negative.   Skin: Negative.      Past Medical History:  Past Medical History:  Diagnosis Date   Complication of anesthesia    Family history of adverse reaction to anesthesia    PT IS ADOPTED   Frequent UTI    in past-with uro w/u and urethral dilation   Guillain-Barre (HCC) 06/1998   Guillain-Barre Plasmaphoresis- hospitalized x 1 week at [facility]   Headache    MIGRAINES   PONV (postoperative nausea and vomiting)     Past Surgical History:  Past Surgical History:  Procedure Laterality Date   CERVICAL BIOPSY  W/ LOOP ELECTRODE EXCISION  07/30/2017   LEEP N/A 07/30/2017   Procedure: LOOP ELECTROSURGICAL EXCISION PROCEDURE (LEEP);  Surgeon: Vena Austria, MD;  Location: [facility] ORS;  Service: Gynecology;  Laterality: N/A;   TONSILLECTOMY  1985    Gynecologic History: No LMP recorded. (Menstrual status: Perimenopausal).  Obstetric History: [id number]  Family History:  Family History  Adopted: Yes  Family history unknown: Yes    Social History:  Social History   Socioeconomic History   Marital status: Married    Spouse name: Not on file   Number of children: Not  on file   Years of education: Not on file   Highest education level: Not on file  Occupational History   Occupation: Housewife/Teacher    Employer: [employer]  Social Needs   Financial resource strain: Not on file   Food insecurity:    Worry: Not on file    Inability: Not on file   Transportation needs:    Medical: Not on file    Non-medical: Not on file  Tobacco Use   Smoking status: Never Smoker   Smokeless tobacco: Never Used  Substance and Sexual Activity   Alcohol use: No    Alcohol/week: 0.0 standard drinks   Drug use: No   Sexual activity: Yes    Birth control/protection: None  Lifestyle   Physical activity:    Days per week: 2 days    Minutes per session: 30 min   Stress: Not at all  Relationships   Social connections:    Talks on phone: Not on file    Gets together: Not on file    Attends religious service: Not on file    Active member of club or organization: Not on file    Attends meetings of clubs or organizations: Not on file    Relationship status: Not on file   Intimate partner violence:    Fear of current or ex partner: Not on file    Emotionally abused: Not on file    Physically abused: Not on file  Forced sexual activity: Not on file  Other Topics Concern   Not on file  Social History Narrative   Unknown family history-adopted   Married 1991- lives with husband   2 children   Housewife-teaching at [employer]    Allergies:  Allergies  Allergen Reactions   Epinephrine Other (See Comments)    TACHYCARDIA EVEN WITH DOSAGES GIVEN IN DENTIST OFFICE   Influenza Vaccines     H/o gillian barre syndrome   Nitrofurantoin Other (See Comments)    ELEVATED LIVER FUNCTIONS   Sulfonamide Derivatives Itching    Medications: Prior to Admission medications   Medication Sig Start Date End Date Taking? Authorizing Provider  SUMAtriptan (IMITREX) 100 MG tablet TAKE 1 TABLET BY MOUTH AT ONSET OF HEADACHE. MAY REPEAT IN 2  HOURS IF NEEDED (MAX 2 DOSES IN 24HRS) 02/08/18   Joaquim Nam, MD    Physical Exam Vitals:  Vitals:   04/05/18 1603  BP: 130/90  Pulse: 70   No LMP recorded. (Menstrual status: Perimenopausal).  General: NAD HEENT: normocephalic, anicteric Pulmonary: No increased work of breathing Abdomen: Soft, non-tender, non-distended.  Umbilicus without lesions.  No hepatomegaly, splenomegaly or masses palpable. No evidence of hernia  Genitourinary:  External: Normal external female genitalia.  Normal urethral meatus, normal  Bartholin's and Skene's glands.    Vagina: Normal vaginal mucosa, no evidence of prolapse.    Cervix: Grossly normal in appearance, no bleeding  Uterus: Non-enlarged, mobile, normal contour.  No CMT  Adnexa: ovaries non-enlarged, no adnexal masses  Rectal: deferred  Lymphatic: no evidence of inguinal lymphadenopathy Extremities: no edema, erythema, or tenderness Neurologic: Grossly intact Psychiatric: mood appropriate, affect full  Female chaperone present for pelvic  portions of the physical exam  Assessment: 53 y.o. [id number] with acute pelvic pain over the past week  Plan: Problem List Items Addressed This Visit    None    Visit Diagnoses    Pelvic pain    -  Primary   Relevant Orders   POCT urinalysis dipstick (Completed)   CBC (Completed)   US Transvaginal Non-OB     1) Pelvic pain - normal exam, benign abdomen.  UA negative today.  Will check CBC to rule out infectious etiology such as appendicitis but given indolent course unlikely.  Will obtain TVUS to rule out ovarian pathology.  2) A total of 15 minutes were spent in face-to-face contact with the patient during this encounter with over half of that time devoted to counseling and coordination of care.  3) Return in about 1 week (around 04/12/2018) for TVUS and follow up.    Vena Austria, MD, [name], [facility]

## 2018-04-17 ENCOUNTER — Telehealth: Payer: BLUE CROSS/BLUE SHIELD | Admitting: Nurse Practitioner

## 2018-04-17 DIAGNOSIS — H1030 Unspecified acute conjunctivitis, unspecified eye: Secondary | ICD-10-CM | POA: Diagnosis not present

## 2018-04-17 MED ORDER — OFLOXACIN 0.3 % OT SOLN: 5.0000 [drp] | Freq: Every day | OTIC | 0 refills | Status: DC

## 2018-04-17 NOTE — Progress Notes (Signed)

## 2018-05-15 IMAGING — US US ABDOMEN LIMITED
1 series · 14 of 25 positions shown · non-contrast
Comparison: None.

CLINICAL DATA: Right upper quadrant pain worse with eating

EXAM:
US ABDOMEN LIMITED - RIGHT UPPER QUADRANT

[Series 1: us abdomen limited · 0.22mm/px · 14 of 40 slices shown]
[im 1/40]
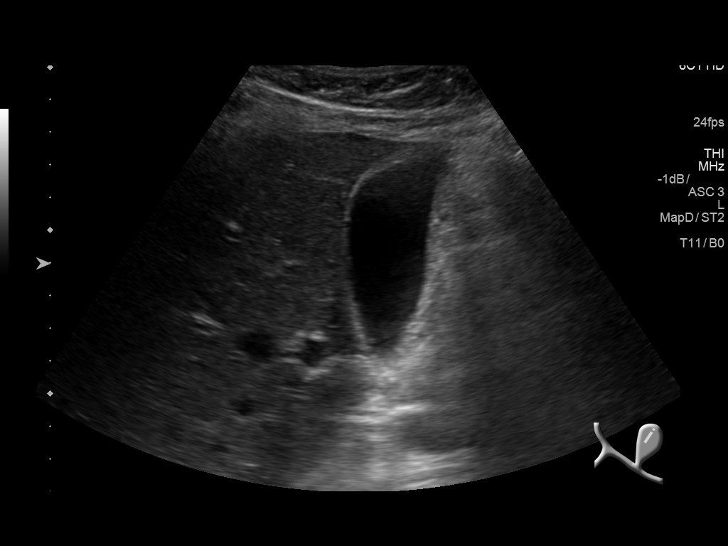
[im 4/40]
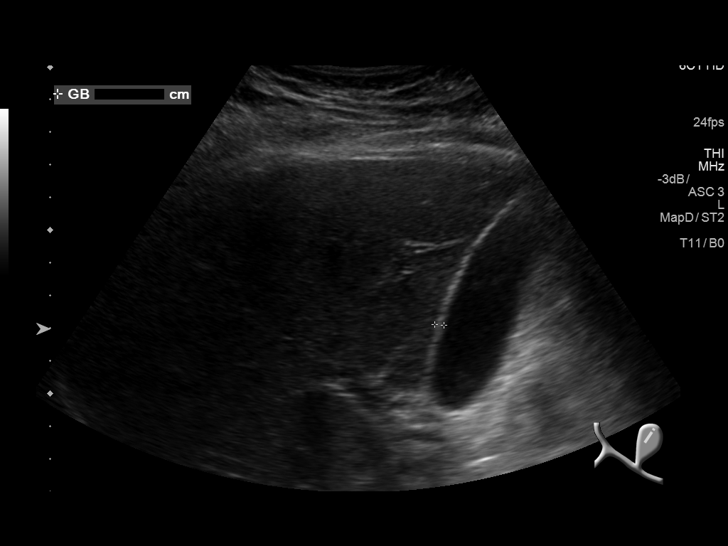
[im 7/40]
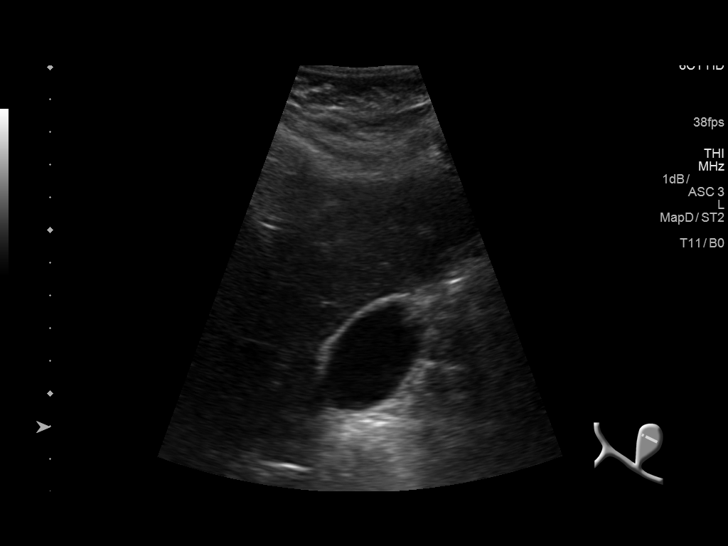
[im 10/40]
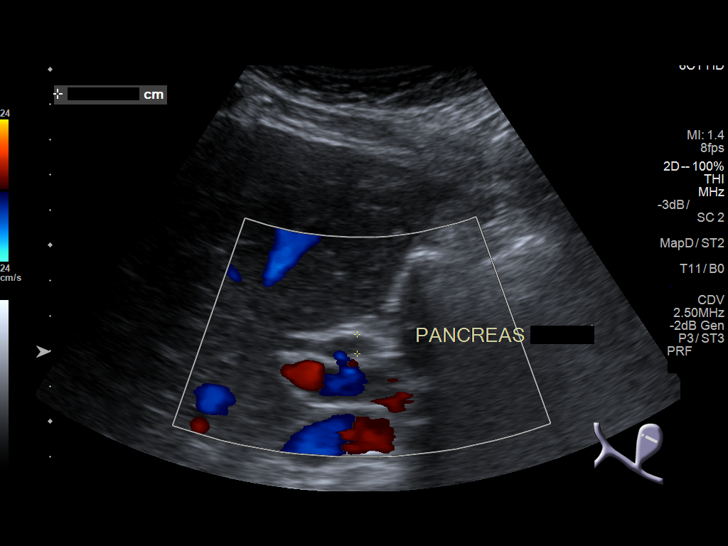
[im 14/40]
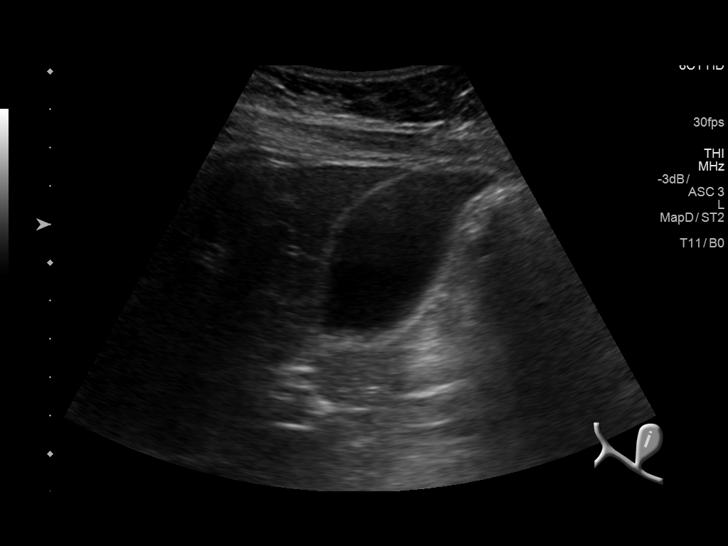
[im 15/40]
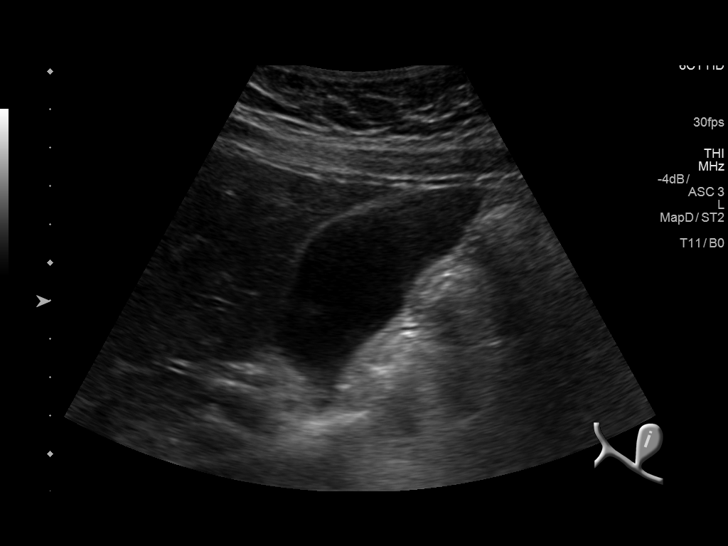
[im 18/40]
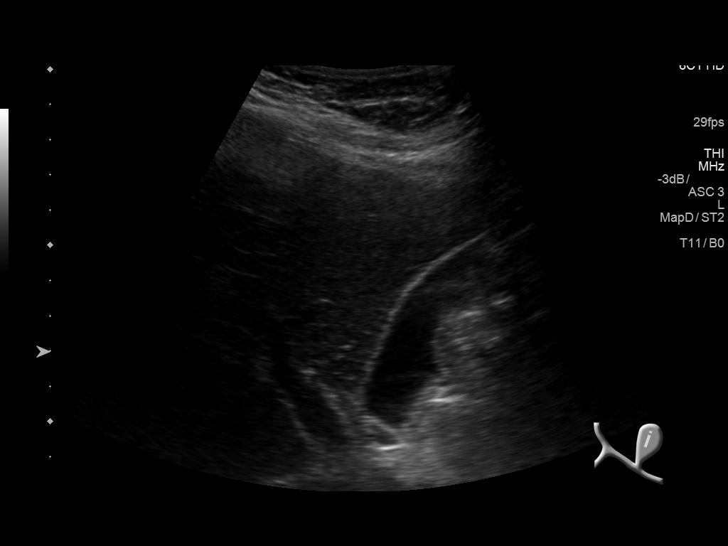
[im 22/40]
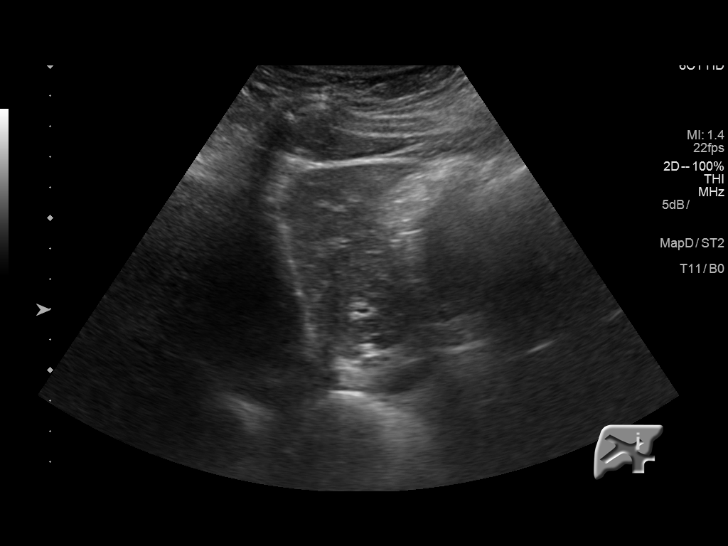
[im 25/40]
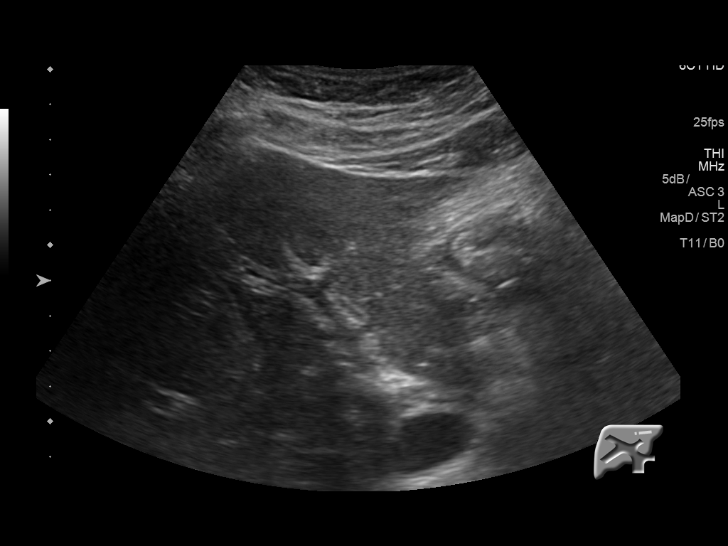
[im 27/40]
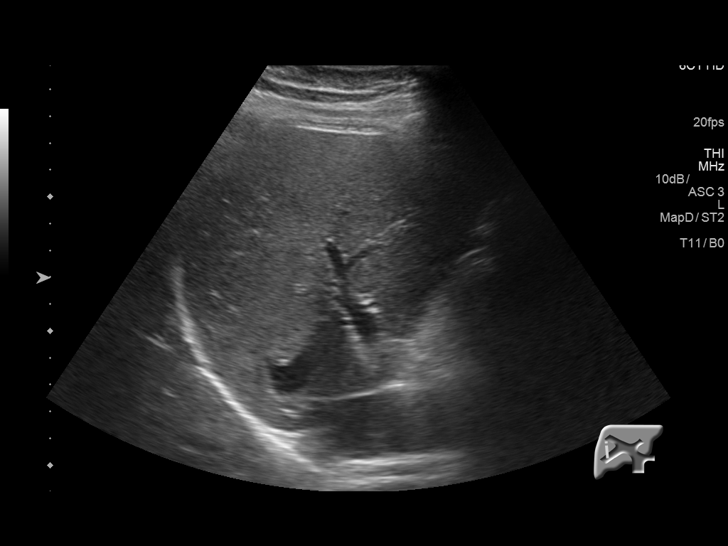
[im 30/40]
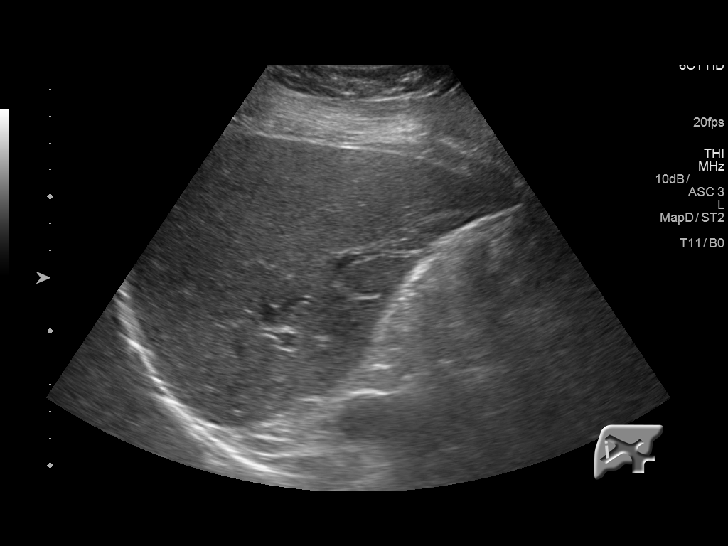
[im 33/40]
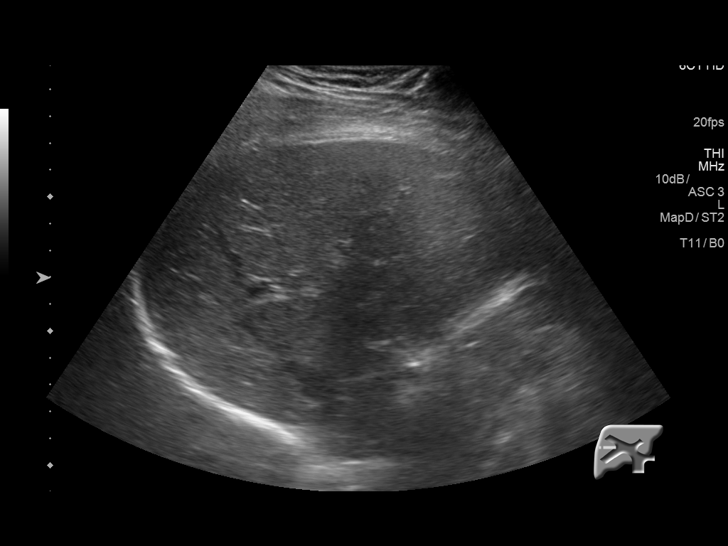
[im 36/40]
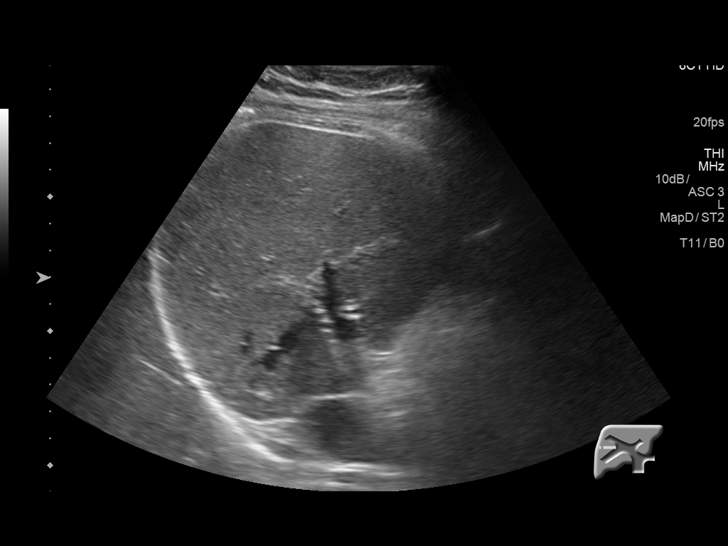
[im 40/40]
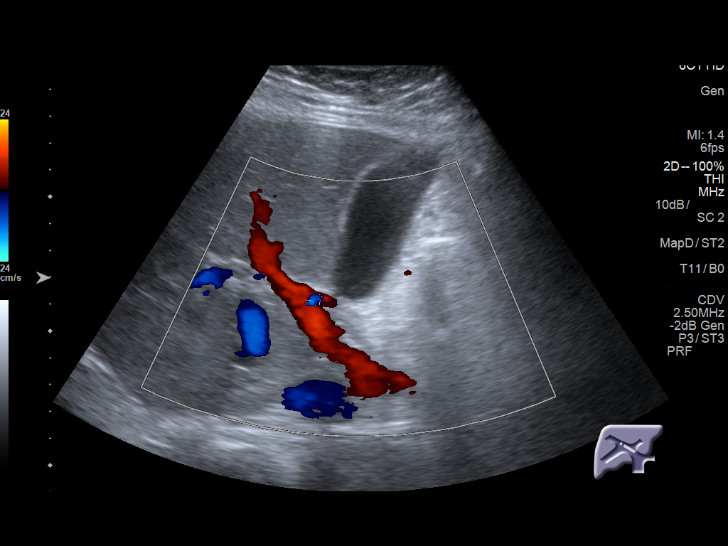

[14 of 25 positions shown; findings below may reference images not displayed]

FINDINGS: Gallbladder:

No gallstones or wall thickening visualized. No sonographic Murphy
sign noted by sonographer.

Common bile duct:

Diameter: 5.2 mm

Liver:

No focal lesion identified. Within normal limits in parenchymal
echogenicity.
IMPRESSION: Negative right upper quadrant abdominal ultrasound

## 2018-07-10 ENCOUNTER — Telehealth: Payer: BLUE CROSS/BLUE SHIELD | Admitting: Nurse Practitioner

## 2018-07-10 DIAGNOSIS — N3000 Acute cystitis without hematuria: Secondary | ICD-10-CM | POA: Diagnosis not present

## 2018-07-10 MED ORDER — CEPHALEXIN 500 MG PO CAPS: 500.0000 mg | ORAL_CAPSULE | Freq: Two times a day (BID) | ORAL | 0 refills | Status: DC

## 2018-07-10 NOTE — Progress Notes (Signed)

## 2018-08-14 ENCOUNTER — Other Ambulatory Visit: Payer: Self-pay | Admitting: Family Medicine

## 2018-08-16 NOTE — Telephone Encounter (Signed)
Electronic refill request. Sumatriptan Last office visit:   02/08/2018 Last Filled:    10 tablet 3 02/08/2018  Please advise.

## 2018-08-17 NOTE — Telephone Encounter (Signed)
Sent. Thanks.   

## 2018-09-27 ENCOUNTER — Ambulatory Visit: Payer: BLUE CROSS/BLUE SHIELD | Admitting: Obstetrics and Gynecology

## 2018-10-03 ENCOUNTER — Telehealth: Payer: BLUE CROSS/BLUE SHIELD | Admitting: Nurse Practitioner

## 2018-10-03 DIAGNOSIS — Z20822 Contact with and (suspected) exposure to covid-19: Secondary | ICD-10-CM

## 2018-10-03 MED ORDER — BENZONATATE 100 MG PO CAPS: 100.0000 mg | ORAL_CAPSULE | Freq: Three times a day (TID) | ORAL | 0 refills | Status: DC | PRN

## 2018-10-03 NOTE — Progress Notes (Signed)
E-Visit for Corona Virus Screening   Your current symptoms could be consistent with the coronavirus.  Many health care providers can now test patients at their office but not all are.  [facility] has multiple testing sites. For information on our COVID testing locations and hours go to [email or web address]  Please quarantine yourself while awaiting your test results.  We are enrolling you in our MyChart Home [facility] for COVID19 . Daily you will receive a questionnaire within the MyChart website. Our COVID 19 response team willl be monitoriing your responses daily.  You can go to one of the  testing sites listed below, while they are opened (see hours). You do not need a doctors order to be tested for covid.You do need to self-isolate until your results return and if positive 14 days from when your symptoms started and until you are 3 days symptom free. I do not know if rapid testing is available in our area or not.  Testing Locations (Monday - Friday, 8 a.m. - 3:30 p.m.) . [place]: [facility], [address], [place], [place]  . [place]: [address] [facility], [address] [place], [place] ([address])  . [facility]: [address], [place], [place] (across from [facility])   COVID-19 is a respiratory illness with symptoms that are similar to the flu. Symptoms are typically mild to moderate, but there have been cases of severe illness and death due to the virus. The following symptoms may appear 2-14 days after exposure: . Fever . Cough . Shortness of breath or difficulty breathing . Chills . Repeated shaking with chills . Muscle pain . Headache . Sore throat . New loss of taste or smell . Fatigue . Congestion or runny nose . Nausea or vomiting . Diarrhea  It is vitally important that if you feel that you have an infection such as this virus or any other  virus that you stay home and away from places where you may spread it to others.  You should self-quarantine for 14 days if you have symptoms that could potentially be coronavirus or have been in close contact a with a person diagnosed with COVID-19 within the last 2 weeks. You should avoid contact with people age 55 and older.   You should wear a mask or cloth face covering over your nose and mouth if you must be around other people or animals, including pets (even at home). Try to stay at least 6 feet away from other people. This will protect the people around you.  You can use medication such as A prescription cough medication called Tessalon Perles 100 mg. You may take 1-2 capsules every 8 hours as needed for cough  You may also take acetaminophen (Tylenol) as needed for fever.   Reduce your risk of any infection by using the same precautions used for avoiding the common cold or flu:  Marland Kitchen. Wash your hands often with soap and warm water for at least 20 seconds.  If soap and water are not readily available, use an alcohol-based hand sanitizer with at least 60% alcohol.  . If coughing or sneezing, cover your mouth and nose by coughing or sneezing into the elbow areas of your shirt or coat, into a tissue or into your sleeve (not your hands). . Avoid shaking hands with others and consider head nods or verbal greetings only. . Avoid touching your eyes, nose, or mouth with unwashed hands.  .Marland Kitchen  Avoid close contact with people who are sick. . Avoid places or events with large numbers of people in one location, like concerts or sporting events. . Carefully consider travel plans you have or are making. . If you are planning any travel outside or inside the [place], visit the CDC's Travelers' Health webpage for the latest health notices. . If you have some symptoms but not all symptoms, continue to monitor at home and seek medical attention if your symptoms worsen. . If you are having a medical emergency, call  911.  HOME CARE . Only take medications as instructed by your medical team. . Drink plenty of fluids and get plenty of rest. . A steam or ultrasonic humidifier can help if you have congestion.   GET HELP RIGHT AWAY IF YOU HAVE EMERGENCY WARNING SIGNS** FOR COVID-19. If you or someone is showing any of these signs seek emergency medical care immediately. Call 911 or proceed to your closest emergency facility if: . You develop worsening high fever. . Trouble breathing . Bluish lips or face . Persistent pain or pressure in the chest . New confusion . Inability to wake or stay awake . You cough up blood. . Your symptoms become more severe  **This list is not all possible symptoms. Contact your medical provider for any symptoms that are sever or concerning to you.   MAKE SURE YOU   Understand these instructions.  Will watch your condition.  Will get help right away if you are not doing well or get worse.  Your e-visit answers were reviewed by a board certified advanced clinical practitioner to complete your personal care plan.  Depending on the condition, your plan could have included both over the counter or prescription medications.  If there is a problem please reply once you have received a response from your provider.  Your safety is important to [place].  If you have drug allergies check your prescription carefully.    You can use MyChart to ask questions about today's visit, request a non-urgent call back, or ask for a work or school excuse for 24 hours related to this e-Visit. If it has been greater than 24 hours you will need to follow up with your provider, or enter a new e-Visit to address those concerns. You will get an e-mail in the next two days asking about your experience.  I hope that your e-visit has been valuable and will speed your recovery. Thank you for using e-visits.  5-10 minutes spent reviewing and documenting in chart.

## 2018-10-05 ENCOUNTER — Encounter (INDEPENDENT_AMBULATORY_CARE_PROVIDER_SITE_OTHER): Payer: Self-pay

## 2018-10-14 ENCOUNTER — Encounter (INDEPENDENT_AMBULATORY_CARE_PROVIDER_SITE_OTHER): Payer: Self-pay

## 2018-10-25 ENCOUNTER — Other Ambulatory Visit: Payer: Self-pay

## 2018-10-25 ENCOUNTER — Encounter: Payer: Self-pay | Admitting: Obstetrics and Gynecology

## 2018-10-25 ENCOUNTER — Ambulatory Visit (INDEPENDENT_AMBULATORY_CARE_PROVIDER_SITE_OTHER): Payer: BC Managed Care – PPO | Admitting: Obstetrics and Gynecology

## 2018-10-25 ENCOUNTER — Other Ambulatory Visit (HOSPITAL_COMMUNITY)
Admission: RE | Admit: 2018-10-25 | Discharge: 2018-10-25 | Disposition: A | Discharge status: Home / Self Care | Payer: BC Managed Care – PPO | Source: Ambulatory Visit | Attending: Obstetrics and Gynecology | Admitting: Obstetrics and Gynecology

## 2018-10-25 VITALS — BP 136/82 | HR 64 | Ht 65.0 in | Wt 220.0 lb

## 2018-10-25 DIAGNOSIS — Z01419 Encounter for gynecological examination (general) (routine) without abnormal findings: Secondary | ICD-10-CM | POA: Diagnosis not present

## 2018-10-25 DIAGNOSIS — N950 Postmenopausal bleeding: Secondary | ICD-10-CM | POA: Diagnosis not present

## 2018-10-25 DIAGNOSIS — Z1239 Encounter for other screening for malignant neoplasm of breast: Secondary | ICD-10-CM

## 2018-10-25 DIAGNOSIS — Z124 Encounter for screening for malignant neoplasm of cervix: Secondary | ICD-10-CM

## 2018-10-25 DIAGNOSIS — D069 Carcinoma in situ of cervix, unspecified: Secondary | ICD-10-CM

## 2018-10-25 NOTE — Progress Notes (Signed)
Gynecology Annual Exam  PCP: Joaquim Nam, MD  Chief Complaint:  Chief Complaint  Patient presents with  . Gynecologic Exam    History of Present Illness:Patient is a 53 y.o. G2P2002 presents for annual exam. The patient has no complaints today.   LMP: No LMP recorded. (Menstrual status: Perimenopausal). No bleeding since LEEP except in April.  Had significant cramping and then very heavy bleeding for a day before stopping.  No bleeding or stopping since  The patient is sexually active. She denies dyspareunia.  The patient does perform self breast exams.  There is no notable family history of breast or ovarian cancer in her family.  The patient wears seatbelts: yes.   The patient has regular exercise: not asked.    The patient denies current symptoms of depression.     Review of Systems: Review of Systems  Constitutional: Negative for chills and fever.  HENT: Negative for congestion.   Respiratory: Negative for cough and shortness of breath.   Cardiovascular: Negative for chest pain and palpitations.  Gastrointestinal: Negative for abdominal pain, constipation, diarrhea, heartburn, nausea and vomiting.  Genitourinary: Negative for dysuria, frequency and urgency.  Skin: Negative for itching and rash.  Neurological: Negative for dizziness and headaches.  Endo/Heme/Allergies: Negative for polydipsia.  Psychiatric/Behavioral: Negative for depression.    Past Medical History:  Past Medical History:  Diagnosis Date  . Complication of anesthesia   . Family history of adverse reaction to anesthesia    PT IS ADOPTED  . Frequent UTI    in past-with uro w/u and urethral dilation  . Guillain-Barre (HCC) 06/1998   Guillain-Barre Plasmaphoresis- hospitalized x 1 week at [facility]  . Headache    MIGRAINES  . PONV (postoperative nausea and vomiting)     Past Surgical History:  Past Surgical History:  Procedure Laterality Date  . CERVICAL BIOPSY  W/ LOOP ELECTRODE EXCISION   07/30/2017  . LEEP N/A 07/30/2017   Procedure: LOOP ELECTROSURGICAL EXCISION PROCEDURE (LEEP);  Surgeon: Vena Austria, MD;  Location: [facility] ORS;  Service: Gynecology;  Laterality: N/A;  . TONSILLECTOMY  1985    Gynecologic History:  No LMP recorded. (Menstrual status: Perimenopausal). Last Pap: Results were:  03/15/2018 Pap NIL HPV negative 07/30/2017 LEEP CIN III with positive ectocervical margin, endocervix and ECC specimen negative 06/25/2017 Pap ASC-Htriaged to excisional procedure rather than colposcopy 06/23/2016 HSIL pap with negative colposcopy  Obstetric History: [id number]  Family History:  Family History  Adopted: Yes  Family history unknown: Yes    Social History:  Social History   Socioeconomic History  . Marital status: Married    Spouse name: Not on file  . Number of children: Not on file  . Years of education: Not on file  . Highest education level: Not on file  Occupational History  . Occupation: Housewife/Teacher    Employer: [employer]  Social Needs  . Financial resource strain: Not on file  . Food insecurity    Worry: Not on file    Inability: Not on file  . Transportation needs    Medical: Not on file    Non-medical: Not on file  Tobacco Use  . Smoking status: Never Smoker  . Smokeless tobacco: Never Used  Substance and Sexual Activity  . Alcohol use: No    Alcohol/week: 0.0 standard drinks  . Drug use: No  . Sexual activity: Yes    Birth control/protection: None  Lifestyle  . Physical activity    Days per week: 2  days    Minutes per session: 30 min  . Stress: Not at all  Relationships  . Social Musicianconnections    Talks on phone: Not on file    Gets together: Not on file    Attends religious service: Not on file    Active member of club or organization: Not on file    Attends meetings of clubs or organizations: Not on file    Relationship status: Not on file  . Intimate partner violence    Fear of current or ex partner: Not on file     Emotionally abused: Not on file    Physically abused: Not on file    Forced sexual activity: Not on file  Other Topics Concern  . Not on file  Social History Narrative   Unknown family history-adopted   Married 1991- lives with husband   2 children   Housewife-teaching at [employer]    Allergies:  Allergies  Allergen Reactions  . Epinephrine Other (See Comments)    TACHYCARDIA EVEN WITH DOSAGES GIVEN IN DENTIST OFFICE  . Influenza Vaccines     H/o gillian barre syndrome  . Nitrofurantoin Other (See Comments)    ELEVATED LIVER FUNCTIONS  . Sulfonamide Derivatives Itching    Medications: Prior to Admission medications   Medication Sig Start Date End Date Taking? Authorizing Provider  benzonatate (TESSALON PERLES) 100 MG capsule Take 1 capsule (100 mg total) by mouth 3 (three) times daily as needed. 10/03/18   Daphine DeutscherMartin, Mary-Margaret, FNP  cephALEXin (KEFLEX) 500 MG capsule Take 1 capsule (500 mg total) by mouth 2 (two) times daily. 07/10/18   Daphine DeutscherMartin, Mary-Margaret, FNP  ofloxacin (FLOXIN OTIC) 0.3 % OTIC solution Place 5 drops into both ears daily. 04/17/18   Daphine DeutscherMartin, Mary-Margaret, FNP  SUMAtriptan (IMITREX) 100 MG tablet TAKE 1 TABLET BY MOUTH AT ONSET OF HEADACHE. MAY REPEAT IN 2 HOURS IF NEEDED (MAX 2 DOSES IN 24HRS) 08/17/18   Joaquim Namuncan, Graham S, MD    Physical Exam Vitals: Blood pressure 136/82, pulse 64, height 5\' 5"  (1.651 m), weight 220 lb (99.8 kg). No LMP recorded. (Menstrual status: Perimenopausal).   General: NAD HEENT: normocephalic, anicteric Thyroid: no enlargement, no palpable nodules Pulmonary: No increased work of breathing, CTAB Cardiovascular: RRR, distal pulses 2+ Breast: Breast symmetrical, no tenderness, no palpable nodules or masses, no skin or nipple retraction present, no nipple discharge.  No axillary or supraclavicular lymphadenopathy. Abdomen: NABS, soft, non-tender, non-distended.  Umbilicus without lesions.  No hepatomegaly,  splenomegaly or masses palpable. No evidence of hernia  Genitourinary:  External: Normal external female genitalia.  Normal urethral meatus, normal Bartholin's and Skene's glands.    Vagina: Normal vaginal mucosa, no evidence of prolapse.    Cervix: Grossly normal in appearance, no bleeding, os is stenotic with postoperative LEEP changes noted  Uterus: Non-enlarged, mobile, normal contour.  No CMT  Adnexa: ovaries non-enlarged, no adnexal masses  Rectal: deferred  Lymphatic: no evidence of inguinal lymphadenopathy Extremities: no edema, erythema, or tenderness Neurologic: Grossly intact Psychiatric: mood appropriate, affect full  Female chaperone present for pelvic and breast  portions of the physical exam     Assessment: 53 y.o. G2P2002 routine annual exam  Plan: Problem List Items Addressed This Visit      Genitourinary   CIN III (cervical intraepithelial neoplasia grade III) with severe dysplasia    Other Visit Diagnoses    Screening for malignant neoplasm of cervix    -  Primary   Relevant Orders  Cytology - PAP   Encounter for gynecological examination without abnormal finding       Breast screening       Relevant Orders   MM 3D SCREEN BREAST BILATERAL   Postmenopausal bleeding       Relevant Orders   Follicle stimulating hormone (Completed)   Estradiol (Completed)   US PELVIS TRANSVAGINAL NON-OB (TV ONLY)      1) Mammogram - recommend yearly screening mammogram.  Mammogram Was ordered today  2) STI screening  was notoffered and therefore not obtained  3) ASCCP guidelines and rational discussed.  Patient opts for yearly screening interval if normal at 2nd 6 month follow up pap.  4) Osteoporosis  - per USPTF routine screening DEXA at age 47  5) Routine healthcare maintenance including cholesterol, diabetes screening discussed managed by PCP  6) ) Postmenopausal bleeding vs hematometria following LEEP - FSH/Estradiol today - Ultrasound to evaluate endometrium  - suspect cervical stenosis secondary to prior LEEP  7) Return in about 1 week (around 11/01/2018) for Ultrasound and follow up.    Malachy Mood, MD Mosetta Pigeon, [facility] 10/25/2018, 4:06 PM

## 2018-10-25 NOTE — Patient Instructions (Signed)
[facility] [address] [place] [address]  [facility]  [address] [place] [address]  Phone: [phone]  

## 2018-10-26 LAB — FOLLICLE STIMULATING HORMONE: FSH: 83.6 m[IU]/mL

## 2018-10-26 LAB — ESTRADIOL: Estradiol: <5 pg/mL

## 2018-10-27 LAB — CYTOLOGY - PAP
Diagnosis: NEGATIVE
High risk HPV: NEGATIVE
Molecular Disclaimer: 56
Molecular Disclaimer: DETECTED
Molecular Disclaimer: NORMAL

## 2018-11-06 IMAGING — DX DG CHEST 2V
2 series · 2 of 2 positions shown · non-contrast
Comparison: No prior.

CLINICAL DATA: Cough.

EXAM:
CHEST  2 VIEW

[chest pa]
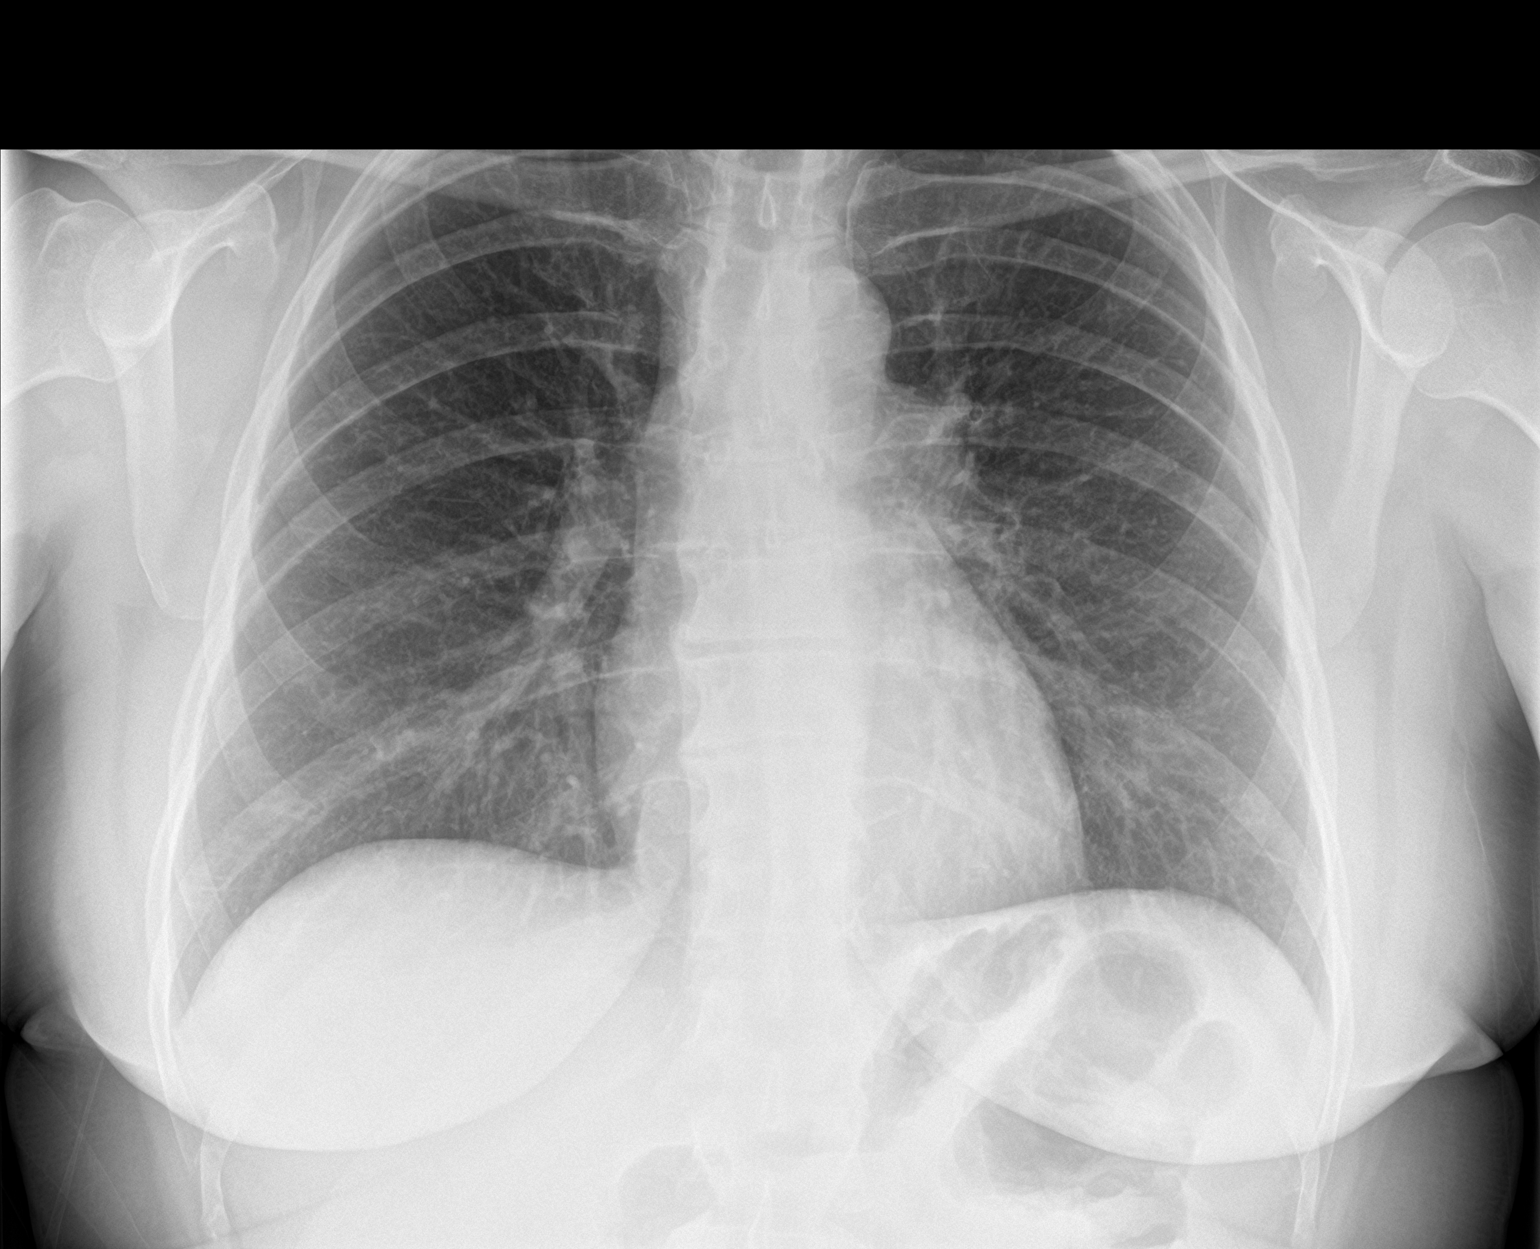

[chest lat]
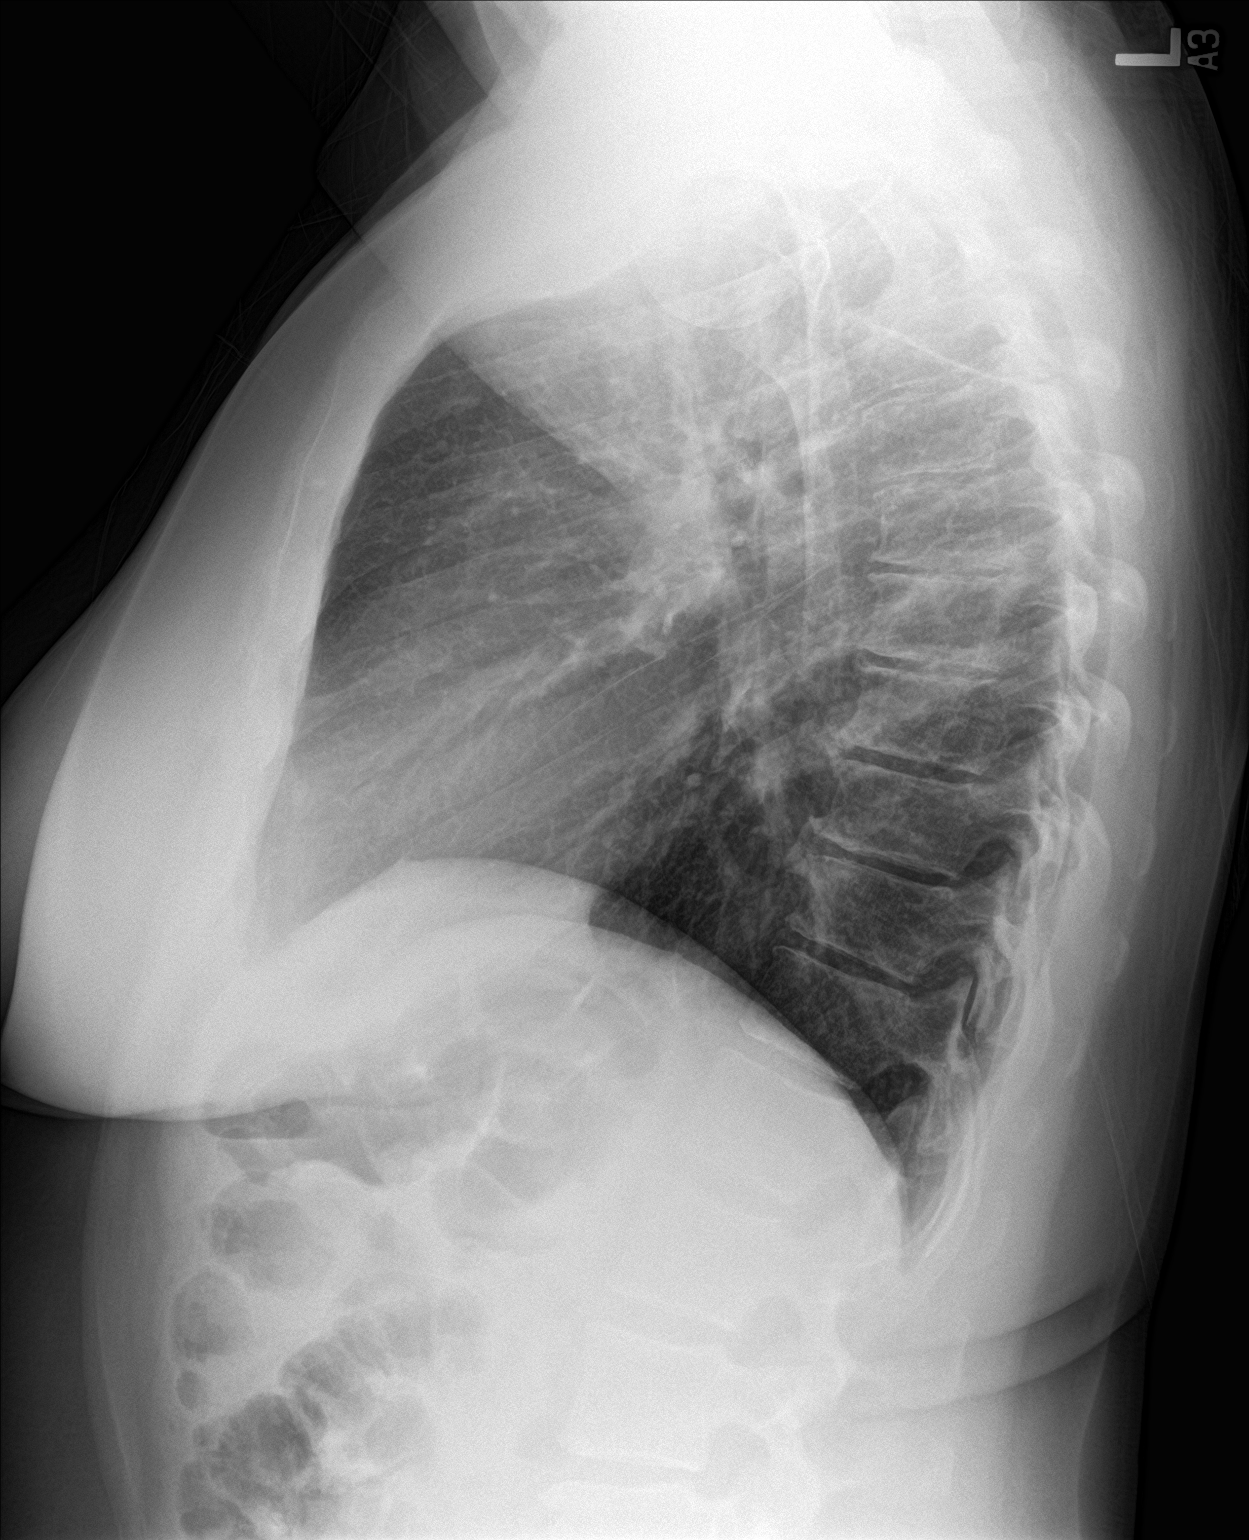

[2 of 2 positions shown; findings below may reference images not displayed]

FINDINGS: Mediastinum and hilar structures normal. Lungs are clear. Heart size
normal. No pleural effusion or pneumothorax.
IMPRESSION: No acute cardiopulmonary disease.

## 2018-11-22 ENCOUNTER — Other Ambulatory Visit: Payer: Self-pay

## 2018-11-22 ENCOUNTER — Ambulatory Visit (INDEPENDENT_AMBULATORY_CARE_PROVIDER_SITE_OTHER): Payer: BC Managed Care – PPO | Admitting: Obstetrics and Gynecology

## 2018-11-22 ENCOUNTER — Encounter: Payer: Self-pay | Admitting: Obstetrics and Gynecology

## 2018-11-22 ENCOUNTER — Ambulatory Visit (INDEPENDENT_AMBULATORY_CARE_PROVIDER_SITE_OTHER): Payer: BC Managed Care – PPO

## 2018-11-22 VITALS — BP 120/84 | Wt 221.0 lb

## 2018-11-22 DIAGNOSIS — N950 Postmenopausal bleeding: Secondary | ICD-10-CM | POA: Diagnosis not present

## 2018-11-22 NOTE — Progress Notes (Signed)
Gynecology Ultrasound Follow Up  Chief Complaint:  Chief Complaint  Patient presents with  . Follow-up    GYN ultrasound     History of Present Illness: Patient is a 53 y.o. female who presents today for ultrasound evaluation of AUB vs PMB.  FSH/Estradiol level were obtained at last visit and seem to suggest postmenopausal although we discussed previously that menopause is a clinical diagnosis.  Ultrasound demonstrates the following findgins Adnexa: no masses seen  Uterus: Non-enlarged with endometrial stripe  Homogenous endometrial stripe well under 26mm Additional: There appears to be a nabothian cyst vs scart issue from prior LEEP 07/30/2017 for ASC-H HPV positive pap 06/25/2017   Pap 03/15/2018 NIL HPV negative absent transformation   She has had not further bleeding episodes  Review of Systems: Review of Systems  Constitutional: Negative.   Gastrointestinal: Negative for abdominal pain.  Genitourinary: Negative.     Past Medical History:  Past Medical History:  Diagnosis Date  . Complication of anesthesia   . Family history of adverse reaction to anesthesia    PT IS ADOPTED  . Frequent UTI    in past-with uro w/u and urethral dilation  . Guillain-Barre ([place]) 06/1998   Guillain-Barre Plasmaphoresis- hospitalized x 1 week at [facility]  . Headache    MIGRAINES  . PONV (postoperative nausea and vomiting)     Past Surgical History:  Past Surgical History:  Procedure Laterality Date  . CERVICAL BIOPSY  W/ LOOP ELECTRODE EXCISION  07/30/2017  . LEEP N/A 07/30/2017   Procedure: LOOP ELECTROSURGICAL EXCISION PROCEDURE (LEEP);  Surgeon: Malachy Mood, MD;  Location: [facility] ORS;  Service: Gynecology;  Laterality: N/A;  . TONSILLECTOMY  1985    Gynecologic History:  No LMP recorded. (Menstrual status: Perimenopausal).  Family History:  Family History  Adopted: Yes  Family history unknown: Yes    Social History:  Social History   Socioeconomic History  . Marital  status: Married    Spouse name: Not on file  . Number of children: Not on file  . Years of education: Not on file  . Highest education level: Not on file  Occupational History  . Occupation: Housewife/Teacher    Employer: [employer]  . Financial resource strain: Not on file  . Food insecurity    Worry: Not on file    Inability: Not on file  . Transportation needs    Medical: Not on file    Non-medical: Not on file  Tobacco Use  . Smoking status: Never Smoker  . Smokeless tobacco: Never Used  Substance and Sexual Activity  . Alcohol use: No    Alcohol/week: 0.0 standard drinks  . Drug use: No  . Sexual activity: Yes    Birth control/protection: None  Lifestyle  . Physical activity    Days per week: 2 days    Minutes per session: 30 min  . Stress: Not at all  Relationships  . Social Herbalist on phone: Not on file    Gets together: Not on file    Attends religious service: Not on file    Active member of club or organization: Not on file    Attends meetings of clubs or organizations: Not on file    Relationship status: Not on file  . Intimate partner violence    Fear of current or ex partner: Not on file    Emotionally abused: Not on file    Physically abused: Not on file  Forced sexual activity: Not on file  Other Topics Concern  . Not on file  Social History Narrative   Unknown family history-adopted   Married 1991- lives with husband   2 children   Housewife-teaching at [employer]    Allergies:  Allergies  Allergen Reactions  . Epinephrine Other (See Comments)    TACHYCARDIA EVEN WITH DOSAGES GIVEN IN DENTIST OFFICE  . Influenza Vaccines     H/o gillian barre syndrome  . Nitrofurantoin Other (See Comments)    ELEVATED LIVER FUNCTIONS  . Sulfonamide Derivatives Itching    Medications: Prior to Admission medications   Medication Sig Start Date End Date Taking? Authorizing Provider  SUMAtriptan (IMITREX) 100 MG  tablet TAKE 1 TABLET BY MOUTH AT ONSET OF HEADACHE. MAY REPEAT IN 2 HOURS IF NEEDED (MAX 2 DOSES IN 24HRS) 08/17/18   Joaquim Nam, MD    Physical Exam Vitals: There were no vitals taken for this visit.  General: NAD HEENT: normocephalic, anicteric Pulmonary: No increased work of breathing Extremities: no edema, erythema, or tenderness Neurologic: Grossly intact, normal gait Psychiatric: mood appropriate, affect full  US Pelvis Transvaginal Non-ob (tv Only)  Result Date: 11/22/2018 Patient Name: NIEVES CHAPA DOB: 02-11-1965 MRN: [id number] ULTRASOUND REPORT Location: [facility] Date of Service: 11/22/2018 Indications:Abnormal Uterine Bleeding Findings: The uterus is anteverted and measures 8.5 x 4.9 x 4.6 cm. Echo texture is heterogenous without evidence of focal masses. The Endometrium measures 2.6 mm. Right Ovary measures 3.0 x 1.2 x 1.6 cm. It is normal in appearance. Left Ovary measures 2.0 x 1.4 x 1.4 cm. It is normal in appearance. Survey of the adnexa demonstrates no adnexal masses. There is no free fluid in the cul de sac. Impression: 1. The endometrium appears thin but is slightly hard to define. 2. The uterus is heterogeneous. There are no obvious fibroids. 3. Normal ovaries. 4. There is a complex cystic  mass with septations, no blood flow, in the cervix measuring 26.8 x 18.6 x 17.2 mm Recommendations: 1.Clinical correlation with the patient's History and Physical Exam. Deanna Artis, RT Images reviewed.  There is a large nabothian cyst visualized in the cervix.  These represent benign cysts that do not carry malignant potential and require no further follow up. Otherwise normal GYN study without visualized pathology.  Vena Austria, MD, Evern Core [facility], [facility] 11/22/2018, 4:07 PM    Assessment: 53 y.o. G2E3662 with postmenopausal uterine bleeding Plan: Problem List Items Addressed This Visit    None    Visit Diagnoses    Postmenopausal  bleeding    -  Primary      1) PMB - normal ultrasound today with endometrial stripe under 60mm.  Nabothian cyst vs scar .  Discussed if any further bleeding recommendation would be to proceed with hysteroscopy, and fractional D&C.  2) A total of 15 minutes were spent in face-to-face contact with the patient during this encounter with over half of that time devoted to counseling and coordination of care.  3) Return in about 1 year (around 11/22/2019) for annual.   Vena Austria, MD, [name], [facility] 11/22/2018, 4:16 PM

## 2018-12-20 ENCOUNTER — Other Ambulatory Visit: Payer: Self-pay

## 2018-12-20 DIAGNOSIS — Z20822 Contact with and (suspected) exposure to covid-19: Secondary | ICD-10-CM

## 2018-12-21 LAB — NOVEL CORONAVIRUS, NAA: SARS-CoV-2, NAA: NOT DETECTED

## 2018-12-21 LAB — INPATIENT

## 2019-01-13 ENCOUNTER — Encounter: Payer: Self-pay | Admitting: Family Medicine

## 2019-01-13 ENCOUNTER — Other Ambulatory Visit: Payer: Self-pay

## 2019-01-13 ENCOUNTER — Ambulatory Visit: Payer: BC Managed Care – PPO | Admitting: Family Medicine

## 2019-01-13 VITALS — BP 122/84 | HR 72 | Temp 97.7°F | Ht 65.0 in | Wt 224.1 lb

## 2019-01-13 DIAGNOSIS — M791 Myalgia, unspecified site: Secondary | ICD-10-CM

## 2019-01-13 DIAGNOSIS — M545 Low back pain, unspecified: Secondary | ICD-10-CM

## 2019-01-13 DIAGNOSIS — G43009 Migraine without aura, not intractable, without status migrainosus: Secondary | ICD-10-CM

## 2019-01-13 LAB — POC URINALSYSI DIPSTICK (AUTOMATED)
Bilirubin, UA: NEGATIVE
Blood, UA: NEGATIVE
Glucose, UA: NEGATIVE
Ketones, UA: NEGATIVE
Leukocytes, UA: NEGATIVE
Nitrite, UA: NEGATIVE
Protein, UA: NEGATIVE
Spec Grav, UA: 1.015 (ref 1.010–1.025)
Urobilinogen, UA: 0.2 E.U./dL
pH, UA: 6 (ref 5.0–8.0)

## 2019-01-13 NOTE — Patient Instructions (Signed)
Clear liquid diet for now.  See if that helps.  Update me if not any better.  Go to the lab on the way out.  We'll contact you with your lab report. Take care.  Glad to see you.

## 2019-01-13 NOTE — Progress Notes (Signed)
This visit occurred during the SARS-CoV-2 public health emergency.  Safety protocols were in place, including screening questions prior to the visit, additional usage of staff PPE, and extensive cleaning of exam room while observing appropriate contact time as indicated for disinfecting solutions.  Back pain. L lower back and L lower abd.  Intermittent.  Some nausea with the pain.  Going on for 4-5 days.  No burning with urination.  H/o UTIs in the past.  No FCNAVD.  No diarrhea.  No blood in stool.    Some leg aches at night, along the quads, for weeks. Not hamstrings.  No calf pain usually.  She has B shoulder pain.  "I feel like I'm sore everywhere."  Fatigue noted.    She was asking about migraine tx options. She has a few headaches per month, but usually 2 days per episode.  We talked about neuro eval but she wanted to defer for now.   Meds, vitals, and allergies reviewed.   ROS: Per HPI unless specifically indicated in ROS section   nad ncat Neck supple, no LA rrr ctab Normal BS L lower abd and L lower back ttp No BLE edema.  Skin well perfused.

## 2019-01-14 LAB — COMPREHENSIVE METABOLIC PANEL
ALT: 31 U/L (ref 0–35)
AST: 27 U/L (ref 0–37)
Albumin: 4.4 g/dL (ref 3.5–5.2)
Alkaline Phosphatase: 71 U/L (ref 39–117)
BUN: 17 mg/dL (ref 6–23)
CO2: 30 mEq/L (ref 19–32)
Calcium: 9.9 mg/dL (ref 8.4–10.5)
Chloride: 101 mEq/L (ref 96–112)
Creatinine, Ser: 0.81 mg/dL (ref 0.40–1.20)
GFR: 73.25 mL/min (ref >60.00)
Glucose, Bld: 89 mg/dL (ref 70–99)
Potassium: 4.8 mEq/L (ref 3.5–5.1)
Sodium: 139 mEq/L (ref 135–145)
Total Bilirubin: 0.4 mg/dL (ref 0.2–1.2)
Total Protein: 6.7 g/dL (ref 6.0–8.3)

## 2019-01-14 LAB — TSH: TSH: 2.01 u[IU]/mL (ref 0.35–4.50)

## 2019-01-14 LAB — CBC WITH DIFFERENTIAL/PLATELET
Basophils Absolute: 0.1 10*3/uL (ref 0.0–0.1)
Basophils Relative: 1.2 % (ref 0.0–3.0)
Eosinophils Absolute: 0.1 10*3/uL (ref 0.0–0.7)
Eosinophils Relative: 1.2 % (ref 0.0–5.0)
HCT: 40.5 % (ref 36.0–46.0)
Hemoglobin: 13.2 g/dL (ref 12.0–15.0)
Lymphocytes Relative: 30.1 % (ref 12.0–46.0)
Lymphs Abs: 2.3 10*3/uL (ref 0.7–4.0)
MCHC: 32.6 g/dL (ref 30.0–36.0)
MCV: 86.8 fl (ref 78.0–100.0)
Monocytes Absolute: 0.6 10*3/uL (ref 0.1–1.0)
Monocytes Relative: 7.7 % (ref 3.0–12.0)
Neutro Abs: 4.5 10*3/uL (ref 1.4–7.7)
Neutrophils Relative %: 59.8 % (ref 43.0–77.0)
Platelets: 314 10*3/uL (ref 150.0–400.0)
RBC: 4.67 Mil/uL (ref 3.87–5.11)
RDW: 12.6 % (ref 11.5–15.5)
WBC: 7.5 10*3/uL (ref 4.0–10.5)

## 2019-01-14 LAB — URINE CULTURE
MICRO NUMBER:: 1185009
SPECIMEN QUALITY:: ADEQUATE

## 2019-01-14 LAB — SEDIMENTATION RATE: Sed Rate: 12 mm/hr (ref 0–30)

## 2019-01-14 LAB — CK: Total CK: 142 U/L (ref 7–177)

## 2019-01-14 LAB — VITAMIN D 25 HYDROXY (VIT D DEFICIENCY, FRACTURES): VITD: 29.36 ng/mL — ABNORMAL LOW (ref 30.00–100.00)

## 2019-01-16 ENCOUNTER — Other Ambulatory Visit: Payer: Self-pay | Admitting: Family Medicine

## 2019-01-16 MED ORDER — VITAMIN D 50 MCG (2000 UT) PO CAPS: 2000.0000 [IU] | ORAL_CAPSULE | Freq: Every day | ORAL | Status: DC

## 2019-01-16 NOTE — Assessment & Plan Note (Signed)
Discussed options.  We can refer to neurology if needed.  She wanted to defer for now.  She can update me as needed.

## 2019-01-16 NOTE — Assessment & Plan Note (Signed)
I would not expect her to have diverticulitis but given that she does have some left lower quadrant abdominal pain it is not unreasonable to have her take a clear liquid diet for the near future and see if that helps at all.  Check routine labs today.  See notes on labs.  We talked about her more recent versus longer term symptoms. Reasonable to check a sed rate given that she has been having leg aches, proximally.  See notes on labs.

## 2019-01-18 ENCOUNTER — Telehealth: Payer: Self-pay

## 2019-01-18 NOTE — Telephone Encounter (Signed)
Best number [phone]  Pt returned your call

## 2019-01-18 NOTE — Telephone Encounter (Signed)
Call returned.

## 2019-01-18 NOTE — Telephone Encounter (Signed)
[place] Night - Client Nonclinical Telephone Record AccessNurse Client [place] Night - Client Client Site [place] Physician Renford Dills - MD Contact Type Call Who Is Calling Patient / Member / Family / Caregiver Caller Name Aerie Donica Caller Phone Number [phone] Call Type Message Only Information Provided Reason for Call Returning a Call from the Office Initial Helper states she is returning a call to the office. Additional Comment Disp. Time Disposition Final User 01/17/2019 5:08:07 PM General Information Provided Yes Hassie Bruce

## 2019-01-20 ENCOUNTER — Other Ambulatory Visit: Payer: Self-pay | Admitting: Family Medicine

## 2019-01-20 NOTE — Telephone Encounter (Signed)
Electronic refill request. Sumatriptan Last office visit:   01/13/2019 Last Filled:    10 tablet 3 08/17/2018  Please advise.

## 2019-01-21 NOTE — Telephone Encounter (Signed)
Sent. Thanks.   

## 2019-03-03 ENCOUNTER — Encounter: Payer: Self-pay | Admitting: Family Medicine

## 2019-03-04 ENCOUNTER — Encounter: Payer: Self-pay | Admitting: Family Medicine

## 2019-05-19 ENCOUNTER — Ambulatory Visit (INDEPENDENT_AMBULATORY_CARE_PROVIDER_SITE_OTHER)
Admission: RE | Admit: 2019-05-19 | Discharge: 2019-05-19 | Disposition: A | Discharge status: Home / Self Care | Payer: BC Managed Care – PPO | Source: Ambulatory Visit | Attending: Family Medicine | Admitting: Family Medicine

## 2019-05-19 ENCOUNTER — Ambulatory Visit: Payer: BC Managed Care – PPO | Admitting: Family Medicine

## 2019-05-19 ENCOUNTER — Encounter: Payer: Self-pay | Admitting: Family Medicine

## 2019-05-19 ENCOUNTER — Other Ambulatory Visit: Payer: Self-pay

## 2019-05-19 VITALS — BP 122/82 | HR 65 | Temp 97.3°F | Ht 65.0 in | Wt 225.4 lb

## 2019-05-19 DIAGNOSIS — M25569 Pain in unspecified knee: Secondary | ICD-10-CM

## 2019-05-19 DIAGNOSIS — M25561 Pain in right knee: Secondary | ICD-10-CM | POA: Diagnosis not present

## 2019-05-19 MED ORDER — MELOXICAM 15 MG PO TABS: 15.0000 mg | ORAL_TABLET | Freq: Every day | ORAL | 1 refills | Status: DC

## 2019-05-19 NOTE — Patient Instructions (Signed)
Take meloxicam with food.  Stop other nsaids.  Go to the lab on the way out.   If you have mychart we'll likely use that to update you.   Take care.  Glad to see you.

## 2019-05-19 NOTE — Progress Notes (Signed)
This visit occurred during the SARS-CoV-2 public health emergency.  Safety protocols were in place, including screening questions prior to the visit, additional usage of staff PPE, and extensive cleaning of exam room while observing appropriate contact time as indicated for disinfecting solutions.  R knee pain.  Today is a "good day."  No clear injury.  Medial knee pain.  Can have some swelling just distally to the knee joint.  Some posterior lateral soreness, possibly from gait change from medial pain.  She has to take both feet individually to the same step while on stairs due to pain.  No trauma.  No locking but occ clicking noted.  Pain with pivot.  She had been walking for exercise and trying to lose weight.  Sometimes she doesn't have more pain after walking.  No bruising, no redness.   She tired ice, ibuprofen.  Both helped some.  voltaren gel didn't help much.   Meds, vitals, and allergies reviewed.   ROS: Per HPI unless specifically indicated in ROS section   nad ncat R knee exam without crepitus but she is tender to palpation in the suprapatellar area.  She has some tenderness along the lateral hamstring insertion and also on the medial joint line.  Lateral joint line is not tender.  No erythema.  No bruising.  No locking or clicking.  Normal range of motion and able to bear weight.

## 2019-05-22 DIAGNOSIS — M25569 Pain in unspecified knee: Secondary | ICD-10-CM | POA: Insufficient documentation

## 2019-05-22 NOTE — Assessment & Plan Note (Signed)
Reasonable to trial meloxicam with routine GI cautions and instructions.  She understood.  Check knee films today.  Update me as needed.  She agrees.  See notes on imaging.

## 2019-06-26 ENCOUNTER — Telehealth: Payer: BC Managed Care – PPO | Admitting: Family

## 2019-06-26 DIAGNOSIS — J069 Acute upper respiratory infection, unspecified: Secondary | ICD-10-CM | POA: Diagnosis not present

## 2019-06-26 MED ORDER — FLUTICASONE PROPIONATE 50 MCG/ACT NA SUSP: 2.0000 | Freq: Every day | NASAL | 6 refills | Status: DC

## 2019-06-26 NOTE — Progress Notes (Signed)

## 2019-07-16 ENCOUNTER — Other Ambulatory Visit: Payer: Self-pay | Admitting: Family Medicine

## 2019-07-18 NOTE — Telephone Encounter (Signed)
Electronic refill request. Meloxicam Last office visit:   05/19/2019 Last Filled:    30 tablet 1 05/19/2019  Please advise.

## 2019-07-18 NOTE — Telephone Encounter (Signed)
Sent. Thanks.   

## 2019-08-03 ENCOUNTER — Encounter: Payer: Self-pay | Admitting: Family Medicine

## 2019-08-31 ENCOUNTER — Telehealth: Payer: Self-pay | Admitting: *Deleted

## 2019-08-31 MED ORDER — AMOXICILLIN 875 MG PO TABS: 875.0000 mg | ORAL_TABLET | Freq: Two times a day (BID) | ORAL | 0 refills | Status: DC

## 2019-08-31 NOTE — Telephone Encounter (Signed)
Given the circumstances I sent the rx for amoxil but I want her to f/u with the dental clinic asap.  Thanks.

## 2019-08-31 NOTE — Telephone Encounter (Signed)
Patient called stating that she has a cracked tooth and her dentist is out of town this week on vacation. Patient stated that she is having a toothache and it may be infected. Patient stated that she knows she is going to eventually have to have it pulled. Patient stated that she contacted the dentist taking call for her dentist and he would not give her an antibiotic and suggested that she have the tooth pulled. Patient stated that she would rather wait and see her dentist when he is back in town. Patient requested that Dr. Para March call her in an antibiotic for her tooth. Patient is aware that Dr. Para March is out of the office today Pharmacy [facility]

## 2019-08-31 NOTE — Telephone Encounter (Signed)
Patient notified as instructed by telephone. Patient stated that she will be in contact with her dentist as soon as he is back in his office.

## 2019-09-14 ENCOUNTER — Telehealth: Payer: BC Managed Care – PPO | Admitting: Physician Assistant

## 2019-09-14 DIAGNOSIS — R399 Unspecified symptoms and signs involving the genitourinary system: Secondary | ICD-10-CM

## 2019-09-14 MED ORDER — CEPHALEXIN 500 MG PO CAPS: 500.0000 mg | ORAL_CAPSULE | Freq: Two times a day (BID) | ORAL | 0 refills | Status: AC

## 2019-09-14 NOTE — Progress Notes (Signed)

## 2019-10-12 ENCOUNTER — Telehealth: Payer: Self-pay | Admitting: *Deleted

## 2019-10-12 MED ORDER — PROMETHAZINE HCL 25 MG RE SUPP: 25.0000 mg | Freq: Three times a day (TID) | RECTAL | 0 refills | Status: DC | PRN

## 2019-10-12 NOTE — Telephone Encounter (Signed)
Patient notified as instructed by telephone and verbalized understanding. 

## 2019-10-12 NOTE — Telephone Encounter (Signed)
Agree. Thanks

## 2019-10-12 NOTE — Telephone Encounter (Signed)
Patient left a voicemail stating that she has had a migraine all day and vomiting. Patient stated that she took a Sumatriptan around 1:30 and another one at 5:00 am. Patient stated that she was able to keep them down for about 30 minutes before throwing them up. Patient wants to know if she can take another one now or do you recommend something else? Pharmacy [facility]

## 2019-10-12 NOTE — Telephone Encounter (Signed)
Thanks

## 2019-10-12 NOTE — Telephone Encounter (Signed)
Recommend use phenergan suppository for nausea - sent to pharmacy. After suppository may try another imitrex tablet today. Update [place] with effect tomorrow.

## 2019-10-31 ENCOUNTER — Ambulatory Visit: Payer: BC Managed Care – PPO | Admitting: Obstetrics and Gynecology

## 2019-11-03 ENCOUNTER — Other Ambulatory Visit: Payer: Self-pay | Admitting: Family Medicine

## 2019-11-03 NOTE — Telephone Encounter (Signed)
Electronic refill request. Meloxicam Last office visit:  05/19/2019 Last Filled:     30 tablet 2 07/18/2019

## 2019-11-04 NOTE — Telephone Encounter (Signed)
Sent. Thanks.   

## 2019-11-30 ENCOUNTER — Other Ambulatory Visit (HOSPITAL_COMMUNITY)
Admission: RE | Admit: 2019-11-30 | Discharge: 2019-11-30 | Disposition: A | Discharge status: Home / Self Care | Payer: BC Managed Care – PPO | Source: Ambulatory Visit | Attending: Obstetrics and Gynecology | Admitting: Obstetrics and Gynecology

## 2019-11-30 ENCOUNTER — Encounter: Payer: Self-pay | Admitting: Obstetrics and Gynecology

## 2019-11-30 ENCOUNTER — Other Ambulatory Visit: Payer: Self-pay

## 2019-11-30 ENCOUNTER — Ambulatory Visit (INDEPENDENT_AMBULATORY_CARE_PROVIDER_SITE_OTHER): Payer: BC Managed Care – PPO | Admitting: Obstetrics and Gynecology

## 2019-11-30 VITALS — BP 142/98 | Ht 65.0 in | Wt 235.0 lb

## 2019-11-30 DIAGNOSIS — Z124 Encounter for screening for malignant neoplasm of cervix: Secondary | ICD-10-CM | POA: Insufficient documentation

## 2019-11-30 DIAGNOSIS — Z1329 Encounter for screening for other suspected endocrine disorder: Secondary | ICD-10-CM

## 2019-11-30 DIAGNOSIS — Z1239 Encounter for other screening for malignant neoplasm of breast: Secondary | ICD-10-CM

## 2019-11-30 DIAGNOSIS — Z1321 Encounter for screening for nutritional disorder: Secondary | ICD-10-CM | POA: Diagnosis not present

## 2019-11-30 DIAGNOSIS — Z131 Encounter for screening for diabetes mellitus: Secondary | ICD-10-CM

## 2019-11-30 DIAGNOSIS — Z01419 Encounter for gynecological examination (general) (routine) without abnormal findings: Secondary | ICD-10-CM | POA: Diagnosis not present

## 2019-11-30 DIAGNOSIS — Z13220 Encounter for screening for lipoid disorders: Secondary | ICD-10-CM | POA: Diagnosis not present

## 2019-11-30 DIAGNOSIS — Z1211 Encounter for screening for malignant neoplasm of colon: Secondary | ICD-10-CM

## 2019-11-30 NOTE — Patient Instructions (Signed)
[facility] [address] [place] [address]  [facility] [place]  [address] [place] [address]  Phone: [phone]  

## 2019-11-30 NOTE — Progress Notes (Signed)
Gynecology Annual Exam  PCP: Joaquim Nam, MD  Chief Complaint:  Chief Complaint  Patient presents with  . Gynecologic Exam    History of Present Illness:Patient is a 54 y.o. G2P2002 presents for annual exam. The patient has no complaints today.   LMP: No LMP recorded. (Menstrual status: Perimenopausal). No PMB  The patient is sexually active. She denies dyspareunia.  The patient does perform self breast exams.  There is no notable family history of breast or ovarian cancer in her family.  The patient wears seatbelts: yes.   The patient has regular exercise: not asked.    The patient denies current symptoms of depression.     Review of Systems: Review of Systems  Constitutional: Negative for chills and fever.  HENT: Negative for congestion.   Respiratory: Negative for cough and shortness of breath.   Cardiovascular: Negative for chest pain and palpitations.  Gastrointestinal: Negative for abdominal pain, constipation, diarrhea, heartburn, nausea and vomiting.  Genitourinary: Negative for dysuria, frequency and urgency.  Skin: Negative for itching and rash.  Neurological: Negative for dizziness and headaches.  Endo/Heme/Allergies: Negative for polydipsia.  Psychiatric/Behavioral: Negative for depression.    Past Medical History:  Patient Active Problem List   Diagnosis Date Noted  . Knee pain 05/22/2019  . CIN III (cervical intraepithelial neoplasia grade III) with severe dysplasia 09/14/2017    03/15/2018 Pap NIL HPV negative 07/30/2017 LEEP CIN III with positive ectocervical margin, endocervix and ECC specimen negative 06/25/2017 Pap ASC-Htriaged to excisional procedure rather than colposcopy 06/23/2016 HSIL pap with negative colposcopy   . Common migraine without aura 07/20/2015  . ASCUS with positive high risk HPV 05/18/2014  . Routine general medical examination at a health care facility 05/12/2014  . Advance care planning 05/12/2014    Living will d/w pt.  Husband designated if patient were incapacitated.     . Rash and nonspecific skin eruption 05/12/2014  . Acute low back pain 04/26/2012  . Borderline hypothyroidism 04/10/2010    Qualifier: Diagnosis of  By: Hetty Ely MD, Franne Grip    . SHOULDER PAIN, RIGHT 01/16/2009    Qualifier: Diagnosis of  By: Hetty Ely MD, Franne Grip    . Guillain-Barre [facility]) 06/1998    Guillain-Barre Plasmaphoresis- hospitalized x 1 week at [facility]     Past Surgical History:  Past Surgical History:  Procedure Laterality Date  . CERVICAL BIOPSY  W/ LOOP ELECTRODE EXCISION  07/30/2017  . LEEP N/A 07/30/2017   Procedure: LOOP ELECTROSURGICAL EXCISION PROCEDURE (LEEP);  Surgeon: Vena Austria, MD;  Location: [facility] ORS;  Service: Gynecology;  Laterality: N/A;  . TONSILLECTOMY  1985    Gynecologic History:  No LMP recorded. (Menstrual status: Perimenopausal). Last Pap: Results were: 10/25/2018 NIL and HR HPV negative   Obstetric History: [id number]  Family History:  Family History  Adopted: Yes  Family history unknown: Yes    Social History:  Social History   Socioeconomic History  . Marital status: Married    Spouse name: Not on file  . Number of children: Not on file  . Years of education: Not on file  . Highest education level: Not on file  Occupational History  . Occupation: Housewife/Teacher    Employer: [employer]  Tobacco Use  . Smoking status: Never Smoker  . Smokeless tobacco: Never Used  Vaping Use  . Vaping Use: Never used  Substance and Sexual Activity  . Alcohol use: No    Alcohol/week: 0.0 standard drinks  . Drug use: No  .  Sexual activity: Yes    Birth control/protection: None  Other Topics Concern  . Not on file  Social History Narrative   Unknown family history-adopted   Married 1991- lives with husband   2 children   Housewife-teaching at [employer]   Social Determinants of Health   Financial Resource Strain:   . Difficulty of Paying Living Expenses:  Not on file  Food Insecurity:   . Worried About Programme researcher, broadcasting/film/video in the Last Year: Not on file  . Ran Out of Food in the Last Year: Not on file  Transportation Needs:   . Lack of Transportation (Medical): Not on file  . Lack of Transportation (Non-Medical): Not on file  Physical Activity:   . Days of Exercise per Week: Not on file  . Minutes of Exercise per Session: Not on file  Stress:   . Feeling of Stress : Not on file  Social Connections:   . Frequency of Communication with Friends and Family: Not on file  . Frequency of Social Gatherings with Friends and Family: Not on file  . Attends Religious Services: Not on file  . Active Member of Clubs or Organizations: Not on file  . Attends Banker Meetings: Not on file  . Marital Status: Not on file  Intimate Partner Violence:   . Fear of Current or Ex-Partner: Not on file  . Emotionally Abused: Not on file  . Physically Abused: Not on file  . Sexually Abused: Not on file    Allergies:  Allergies  Allergen Reactions  . Epinephrine Other (See Comments)    TACHYCARDIA EVEN WITH DOSAGES GIVEN IN DENTIST OFFICE  . Influenza Vaccines     H/o gillian barre syndrome  . Nitrofurantoin Other (See Comments)    ELEVATED LIVER FUNCTIONS  . Sulfonamide Derivatives Itching    Medications: Prior to Admission medications   Medication Sig Start Date End Date Taking? Authorizing Provider  Cholecalciferol (VITAMIN D) 50 MCG (2000 UT) CAPS Take 1 capsule (2,000 Units total) by mouth daily. 01/16/19  Yes Joaquim Nam, MD  fluticasone [facility]) 50 MCG/ACT nasal spray Place 2 sprays into both nostrils daily. 06/26/19  Yes Hawks, Christy A, FNP  meloxicam (MOBIC) 15 MG tablet Take 1 tablet (15 mg total) by mouth daily. With food 11/04/19  Yes Joaquim Nam, MD  Multiple Vitamin (MULTIVITAMIN) tablet Take 1 tablet by mouth daily.   Yes [provider]  SUMAtriptan (IMITREX) 100 MG tablet TAKE 1 TABLET BY MOUTH AT ONSET  OF HEADACHE. MAY REPEAT IN 2 HOURS IF NEEDED (MAX 2 DOSES IN 24HRS) 01/21/19  Yes Joaquim Nam, MD  amoxicillin (AMOXIL) 875 MG tablet Take 1 tablet (875 mg total) by mouth 2 (two) times daily. 08/31/19   Joaquim Nam, MD  promethazine (PHENERGAN) 25 MG suppository Place 1 suppository (25 mg total) rectally every 8 (eight) hours as needed for nausea or vomiting. 10/12/19   Eustaquio Boyden, MD    Physical Exam Vitals: Blood pressure (!) 142/98, height 5\' 5"  (1.651 m), weight 235 lb (106.6 kg).  General: NAD HEENT: normocephalic, anicteric Thyroid: no enlargement, no palpable nodules Pulmonary: No increased work of breathing, CTAB Cardiovascular: RRR, distal pulses 2+ Breast: Breast symmetrical, no tenderness, no palpable nodules or masses, no skin or nipple retraction present, no nipple discharge.  No axillary or supraclavicular lymphadenopathy. Abdomen: NABS, soft, non-tender, non-distended.  Umbilicus without lesions.  No hepatomegaly, splenomegaly or masses palpable. No evidence of hernia  Genitourinary:  External: Normal external female genitalia.  Normal urethral meatus, normal Bartholin's and Skene's glands.    Vagina: Normal vaginal mucosa, no evidence of prolapse.    Cervix: Grossly normal in appearance, no bleeding  Uterus: Non-enlarged, mobile, normal contour.  No CMT  Adnexa: ovaries non-enlarged, no adnexal masses  Rectal: deferred  Lymphatic: no evidence of inguinal lymphadenopathy Extremities: no edema, erythema, or tenderness Neurologic: Grossly intact Psychiatric: mood appropriate, affect full  Female chaperone present for pelvic and breast  portions of the physical exam  Immunization History  Administered Date(s) Administered  . PFIZER SARS-COV-2 Vaccination 08/12/2019, 09/02/2019  . Td 11/03/2000  . Tdap 05/12/2014      Assessment: 54 y.o. G2P2002 routine annual exam  Plan: Problem List Items Addressed This Visit    None    Visit Diagnoses     Encounter for gynecological examination without abnormal finding    -  Primary   Breast screening       Relevant Orders   MM 3D SCREEN BREAST BILATERAL   Screening for malignant neoplasm of cervix       Relevant Orders   Cytology - PAP   Diabetes mellitus screening       Relevant Orders   CMP14+LP+TP+TSH+CBC/Plt   Thyroid disorder screening       Relevant Orders   CMP14+LP+TP+TSH+CBC/Plt   Lipid screening       Relevant Orders   CMP14+LP+TP+TSH+CBC/Plt   Encounter for vitamin deficiency screening       Relevant Orders   Vitamin D (25 hydroxy)   Colon cancer screening       Relevant Orders   Ambulatory referral to Gastroenterology      1) Mammogram - recommend yearly screening mammogram.  Mammogram Was ordered today  2) STI screening  was notoffered and therefore not obtained  3) ASCCP guidelines and rational discussed.  Patient opts for every 3 years screening interval  4) Osteoporosis  - per USPTF routine screening DEXA at age 49   5) Routine healthcare maintenance including cholesterol, diabetes screening discussed Ordered today  6) Colonoscopy - ordered  7) Return in about 1 year (around 11/29/2020) for annual.    Vena Austria, MD Domingo Pulse, [facility] 11/30/2019, 3:18 PM

## 2019-12-02 ENCOUNTER — Other Ambulatory Visit: Payer: Self-pay | Admitting: Obstetrics and Gynecology

## 2019-12-02 ENCOUNTER — Other Ambulatory Visit: Payer: BC Managed Care – PPO

## 2019-12-02 ENCOUNTER — Other Ambulatory Visit: Payer: Self-pay

## 2019-12-02 DIAGNOSIS — Z1329 Encounter for screening for other suspected endocrine disorder: Secondary | ICD-10-CM | POA: Diagnosis not present

## 2019-12-02 DIAGNOSIS — Z131 Encounter for screening for diabetes mellitus: Secondary | ICD-10-CM | POA: Diagnosis not present

## 2019-12-02 DIAGNOSIS — Z13220 Encounter for screening for lipoid disorders: Secondary | ICD-10-CM | POA: Diagnosis not present

## 2019-12-02 DIAGNOSIS — Z1321 Encounter for screening for nutritional disorder: Secondary | ICD-10-CM | POA: Diagnosis not present

## 2019-12-02 LAB — CYTOLOGY - PAP
Comment: NEGATIVE
Diagnosis: NEGATIVE
High risk HPV: NEGATIVE

## 2019-12-03 LAB — CMP14+LP+TP+TSH+CBC/PLT
ALT: 29 IU/L (ref 0–32)
AST: 26 IU/L (ref 0–40)
Albumin/Globulin Ratio: 1.8 (ref 1.2–2.2)
Albumin: 4.3 g/dL (ref 3.8–4.9)
Alkaline Phosphatase: 86 IU/L (ref 44–121)
BUN/Creatinine Ratio: 14 (ref 9–23)
BUN: 10 mg/dL (ref 6–24)
Bilirubin Total: 0.4 mg/dL (ref 0.0–1.2)
CO2: 25 mmol/L (ref 20–29)
Calcium: 9.8 mg/dL (ref 8.7–10.2)
Chloride: 102 mmol/L (ref 96–106)
Cholesterol, Total: 197 mg/dL (ref 100–199)
Creatinine, Ser: 0.71 mg/dL (ref 0.57–1.00)
Free Thyroxine Index: 2.2 (ref 1.2–4.9)
GFR calc Af Amer: 110 mL/min/{1.73_m2} (ref >59)
GFR calc non Af Amer: 96 mL/min/{1.73_m2} (ref >59)
Globulin, Total: 2.4 g/dL (ref 1.5–4.5)
Glucose: 82 mg/dL (ref 65–99)
HDL: 63 mg/dL (ref >39)
Hematocrit: 41.6 % (ref 34.0–46.6)
Hemoglobin: 13.1 g/dL (ref 11.1–15.9)
LDL Chol Calc (NIH): 120 mg/dL — ABNORMAL HIGH (ref 0–99)
LDL/HDL Ratio: 1.9 ratio (ref 0.0–3.2)
MCH: 27.2 pg (ref 26.6–33.0)
MCHC: 31.5 g/dL (ref 31.5–35.7)
MCV: 87 fL (ref 79–97)
Platelets: 359 10*3/uL (ref 150–450)
Potassium: 4.7 mmol/L (ref 3.5–5.2)
RBC: 4.81 x10E6/uL (ref 3.77–5.28)
RDW: 12.1 % (ref 11.7–15.4)
Sodium: 140 mmol/L (ref 134–144)
T3 Uptake Ratio: 26 % (ref 24–39)
T4, Total: 8.4 ug/dL (ref 4.5–12.0)
TSH: 4.38 u[IU]/mL (ref 0.450–4.500)
Total Protein: 6.7 g/dL (ref 6.0–8.5)
Triglycerides: 76 mg/dL (ref 0–149)
VLDL Cholesterol Cal: 14 mg/dL (ref 5–40)
WBC: 5.6 10*3/uL (ref 3.4–10.8)

## 2019-12-03 LAB — VITAMIN D 25 HYDROXY (VIT D DEFICIENCY, FRACTURES): Vit D, 25-Hydroxy: 44.4 ng/mL (ref 30.0–100.0)

## 2019-12-06 LAB — VITAMIN D 25 HYDROXY (VIT D DEFICIENCY, FRACTURES)

## 2019-12-07 ENCOUNTER — Telehealth (INDEPENDENT_AMBULATORY_CARE_PROVIDER_SITE_OTHER): Payer: Self-pay | Admitting: Gastroenterology

## 2019-12-07 ENCOUNTER — Other Ambulatory Visit: Payer: Self-pay

## 2019-12-07 DIAGNOSIS — Z1211 Encounter for screening for malignant neoplasm of colon: Secondary | ICD-10-CM

## 2019-12-07 MED ORDER — NA SULFATE-K SULFATE-MG SULF 17.5-3.13-1.6 GM/177ML PO SOLN
1.0000 | Freq: Once | ORAL | 0 refills | Status: AC
Start: 2019-12-07 — End: 2019-12-07

## 2019-12-07 NOTE — Progress Notes (Signed)
Gastroenterology Pre-Procedure Review  Request Date: Friday 01/06/20 Requesting Physician: Dr. Vicente Males  PATIENT REVIEW QUESTIONS: The patient responded to the following health history questions as indicated:    1. Are you having any GI issues? no 2. Do you have a personal history of Polyps? no 3. Do you have a family history of Colon Cancer or Polyps? Unsure of family history adopted. 4. Diabetes Mellitus? no 5. Joint replacements in the past 12 months?no 6. Major health problems in the past 3 months?no 7. Any artificial heart valves, MVP, or defibrillator?no    MEDICATIONS & ALLERGIES:    Patient reports the following regarding taking any anticoagulation/antiplatelet therapy:   Plavix, Coumadin, Eliquis, Xarelto, Lovenox, Pradaxa, Brilinta, or Effient? no Aspirin? no  Patient confirms/reports the following medications:  Current Outpatient Medications  Medication Sig Dispense Refill  . Cholecalciferol (VITAMIN D) 50 MCG (2000 UT) CAPS Take 1 capsule (2,000 Units total) by mouth daily.    . meloxicam (MOBIC) 15 MG tablet Take 1 tablet (15 mg total) by mouth daily. With food 30 tablet 5  . Multiple Vitamin (MULTIVITAMIN) tablet Take 1 tablet by mouth daily.    . SUMAtriptan (IMITREX) 100 MG tablet TAKE 1 TABLET BY MOUTH AT ONSET OF HEADACHE. MAY REPEAT IN 2 HOURS IF NEEDED (MAX 2 DOSES IN 24HRS) 10 tablet 3  . fluticasone (FLONASE) 50 MCG/ACT nasal spray Place 2 sprays into both nostrils daily. (Patient not taking: Reported on 12/07/2019) 16 g 6  . Na Sulfate-K Sulfate-Mg Sulf 17.5-3.13-1.6 GM/177ML SOLN Take 1 kit by mouth once for 1 dose. 354 mL 0   No current facility-administered medications for this visit.    Patient confirms/reports the following allergies:  Allergies  Allergen Reactions  . Epinephrine Other (See Comments)    TACHYCARDIA EVEN WITH DOSAGES GIVEN IN DENTIST OFFICE  . Influenza Vaccines     H/o gillian barre syndrome  . Nitrofurantoin Other (See Comments)     ELEVATED LIVER FUNCTIONS  . Sulfonamide Derivatives Itching    Orders Placed This Encounter  Procedures  . Procedural/ Surgical Case Request: COLONOSCOPY WITH PROPOFOL    Standing Status:   Standing    Number of Occurrences:   1    Order Specific Question:   Pre-op diagnosis    Answer:   screening colonoscopy    Order Specific Question:   CPT Code    Answer:   23536    AUTHORIZATION INFORMATION Primary Insurance: 1D#: Group #:  Secondary Insurance: 1D#: Group #:  SCHEDULE INFORMATION: Date: Friday 01/06/20 Time: Location: [place]

## 2019-12-28 ENCOUNTER — Telehealth: Payer: Self-pay | Admitting: Gastroenterology

## 2019-12-28 ENCOUNTER — Telehealth: Payer: Self-pay

## 2019-12-28 NOTE — Telephone Encounter (Signed)
Colonoscopy has been rescheduled to Friday 02/24/20 from 01/06/20 with Dr. Tobi Bastos.  Trish in Endo will be notified on Monday to change procedure date.  Endo is closed in observance of Thanksgiving.  Thanks,  [place], [place]

## 2020-01-03 ENCOUNTER — Other Ambulatory Visit: Payer: Self-pay

## 2020-01-03 DIAGNOSIS — Z1211 Encounter for screening for malignant neoplasm of colon: Secondary | ICD-10-CM

## 2020-01-04 ENCOUNTER — Other Ambulatory Visit: Admission: RE | Admit: 2020-01-04 | Payer: BC Managed Care – PPO | Source: Ambulatory Visit

## 2020-01-18 NOTE — Telephone Encounter (Signed)
open encounter in error 

## 2020-01-24 ENCOUNTER — Telehealth: Payer: BC Managed Care – PPO | Admitting: Nurse Practitioner

## 2020-01-24 DIAGNOSIS — R399 Unspecified symptoms and signs involving the genitourinary system: Secondary | ICD-10-CM | POA: Diagnosis not present

## 2020-01-24 MED ORDER — CEPHALEXIN 500 MG PO CAPS: 500.0000 mg | ORAL_CAPSULE | Freq: Two times a day (BID) | ORAL | 0 refills | Status: DC

## 2020-01-24 NOTE — Progress Notes (Signed)

## 2020-02-13 ENCOUNTER — Other Ambulatory Visit: Payer: Self-pay

## 2020-02-13 MED ORDER — SUMATRIPTAN SUCCINATE 100 MG PO TABS
ORAL_TABLET | ORAL | 3 refills | Status: DC
Start: 2020-02-13 — End: 2020-06-13

## 2020-02-13 NOTE — Progress Notes (Signed)
It looks like the prescription for Imitrex was already sent.  Thanks.  Please schedule a yearly physical when possible.  Later this spring or summer should be fine.

## 2020-02-22 ENCOUNTER — Other Ambulatory Visit: Admission: RE | Admit: 2020-02-22 | Payer: BC Managed Care – PPO | Source: Ambulatory Visit

## 2020-02-24 ENCOUNTER — Encounter: Admission: RE | Payer: Self-pay | Source: Home / Self Care

## 2020-02-24 ENCOUNTER — Ambulatory Visit
Admission: RE | Admit: 2020-02-24 | Payer: BC Managed Care – PPO | Source: Home / Self Care | Admitting: Gastroenterology

## 2020-02-24 SURGERY — COLONOSCOPY WITH PROPOFOL
Anesthesia: General

## 2020-03-18 ENCOUNTER — Telehealth: Payer: BC Managed Care – PPO | Admitting: Physician Assistant

## 2020-03-18 ENCOUNTER — Encounter: Payer: Self-pay | Admitting: Physician Assistant

## 2020-03-18 DIAGNOSIS — N3000 Acute cystitis without hematuria: Secondary | ICD-10-CM

## 2020-03-18 NOTE — Progress Notes (Signed)
Based on what you shared with me, I feel your condition warrants further evaluation and I recommend that you be seen for a face to face office visit.  [name],  Sorry you are not feeling well! I would recommend a face to face evaluation for your symptoms to ensure that appropriate labs and diagnosis are done. It appears that you may have had similar symptoms more than once in the past 6 months. Frequent UTIs would recommend further evaluation.    NOTE: If you entered your credit card information for this eVisit, you will not be charged. You may see a "hold" on your card for the $35 but that hold will drop off and you will not have a charge processed.   If you are having a true medical emergency please call 911.      For an urgent face to face visit, [place] has five urgent care centers for your convenience:     [facility] at [facility] Directions [phone] [address] [place], [place] [address] . 10 am - 6pm Monday - Friday    [facility] Urgent Care Center [facility]) Get Driving Directions [phone] [address] [place], [place] [address] . 10 am to 8 pm Monday-Friday . 12 pm to 8 pm [facility] Urgent Care at [facility] Get Driving Directions [phone] [address] [place], [place] [address] . 8 am to 8 pm Monday-Friday . 9 am to 6 pm Saturday . 11 am to 6 pm Sunday     [facility] Health Urgent Care at [facility] Get Driving Directions  [phone] [address] [place], [place] [address] . 8 am to 8 pm Monday-Friday . 8 am to 4 pm [facility] Urgent Care at [facility] Directions [phone] [address] [place], [place] [address] . 12 pm to 6 pm Monday-Friday      Your e-visit answers were reviewed by a board certified advanced clinical practitioner to complete your personal care plan.  Thank you for  using e-Visits.    I spent 5-10 minutes on review and completion of this note- [facility]

## 2020-03-19 ENCOUNTER — Telehealth: Payer: Self-pay | Admitting: *Deleted

## 2020-03-19 MED ORDER — CEPHALEXIN 500 MG PO CAPS: 500.0000 mg | ORAL_CAPSULE | Freq: Two times a day (BID) | ORAL | 0 refills | Status: DC

## 2020-03-19 NOTE — Telephone Encounter (Signed)
Returning phone call °

## 2020-03-19 NOTE — Telephone Encounter (Signed)
Patient left a voicemail stating that she knows that she has a UTI. Patient complains of urine being cloudy, having an odor and burning with urination. Patient stated that this has been going on for a while but her mother-in-law has been sick and passed away. Patient stated that there is no way that she can come in today because of the funeral. Patient stated that she has UTI's several times a year.Patient stated that she has been taking Cystec.  Patient wants to know if Dr. Para March will send in an antibiotic this time for her because of her circumstances.  Pharmacy [facility]

## 2020-03-19 NOTE — Telephone Encounter (Signed)
Patient aware rx was sent and advised to call back if no better after abx.

## 2020-03-19 NOTE — Telephone Encounter (Signed)
I am sorry for her loss.  I would drink plenty of fluid and start keflex.  rx sent.  F/u here if not better/resolved at the end of the abx.  Thanks.

## 2020-03-19 NOTE — Telephone Encounter (Signed)
LMTCB

## 2020-04-19 ENCOUNTER — Telehealth: Payer: Self-pay | Admitting: Family Medicine

## 2020-04-19 NOTE — Telephone Encounter (Signed)
Called PT and she is scheduled for 6/8 for labs and 6/17 @ 9 for CPE , it wouldn't let me add to the note you sent due to its attached to a medication

## 2020-04-20 NOTE — Telephone Encounter (Signed)
Noted. Thanks.

## 2020-05-09 ENCOUNTER — Other Ambulatory Visit: Payer: Self-pay | Admitting: Family Medicine

## 2020-05-09 NOTE — Telephone Encounter (Signed)
Sent. Thanks.   

## 2020-05-09 NOTE — Telephone Encounter (Signed)
Refill request for Meloxicam 15 mg tablets  LOV - 05/19/19 Next OV - 07/20/20 Last refill - 11/04/19 #30/5

## 2020-06-12 ENCOUNTER — Other Ambulatory Visit: Payer: Self-pay | Admitting: Family Medicine

## 2020-07-11 ENCOUNTER — Other Ambulatory Visit: Payer: BC Managed Care – PPO

## 2020-07-11 ENCOUNTER — Other Ambulatory Visit: Payer: Self-pay

## 2020-07-11 ENCOUNTER — Other Ambulatory Visit (INDEPENDENT_AMBULATORY_CARE_PROVIDER_SITE_OTHER): Payer: BC Managed Care – PPO

## 2020-07-11 ENCOUNTER — Other Ambulatory Visit: Payer: Self-pay | Admitting: Family Medicine

## 2020-07-11 DIAGNOSIS — G43009 Migraine without aura, not intractable, without status migrainosus: Secondary | ICD-10-CM | POA: Diagnosis not present

## 2020-07-11 LAB — BASIC METABOLIC PANEL
BUN: 14 mg/dL (ref 6–23)
CO2: 30 mEq/L (ref 19–32)
Calcium: 9.8 mg/dL (ref 8.4–10.5)
Chloride: 103 mEq/L (ref 96–112)
Creatinine, Ser: 0.74 mg/dL (ref 0.40–1.20)
GFR: 90.03 mL/min (ref >60.00)
Glucose, Bld: 91 mg/dL (ref 70–99)
Potassium: 5.3 mEq/L — ABNORMAL HIGH (ref 3.5–5.1)
Sodium: 138 mEq/L (ref 135–145)

## 2020-07-11 LAB — TSH: TSH: 2.97 u[IU]/mL (ref 0.35–4.50)

## 2020-07-20 ENCOUNTER — Other Ambulatory Visit: Payer: Self-pay

## 2020-07-20 ENCOUNTER — Ambulatory Visit (INDEPENDENT_AMBULATORY_CARE_PROVIDER_SITE_OTHER): Payer: BC Managed Care – PPO | Admitting: Family Medicine

## 2020-07-20 ENCOUNTER — Encounter: Payer: Self-pay | Admitting: Family Medicine

## 2020-07-20 VITALS — BP 118/84 | HR 66 | Temp 97.3°F | Ht 65.0 in | Wt 187.0 lb

## 2020-07-20 DIAGNOSIS — Z1211 Encounter for screening for malignant neoplasm of colon: Secondary | ICD-10-CM | POA: Diagnosis not present

## 2020-07-20 DIAGNOSIS — Z0000 Encounter for general adult medical examination without abnormal findings: Secondary | ICD-10-CM | POA: Diagnosis not present

## 2020-07-20 DIAGNOSIS — R251 Tremor, unspecified: Secondary | ICD-10-CM

## 2020-07-20 DIAGNOSIS — G43009 Migraine without aura, not intractable, without status migrainosus: Secondary | ICD-10-CM

## 2020-07-20 DIAGNOSIS — Z7189 Other specified counseling: Secondary | ICD-10-CM

## 2020-07-20 NOTE — Progress Notes (Signed)
This visit occurred during the SARS-CoV-2 public health emergency.  Safety protocols were in place, including screening questions prior to the visit, additional usage of staff PPE, and extensive cleaning of exam room while observing appropriate contact time as indicated for disinfecting solutions.  CPE- See plan.  Routine anticipatory guidance given to patient.  See health maintenance.  The possibility exists that previously documented standard health maintenance information may have been brought forward from a previous encounter into this note.  If needed, that same information has been updated to reflect the current situation based on today's encounter.    Tetanus 2016 Flu not indicated Covid vaccine prev done.   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   PNA not due.   DXA not due.   Mammogram due, d/w pt.  She'll call about an appointment Pap per gyn- I'll defer.  Patient agrees. No menses.   D/w patient [id number] for colon cancer screening, including IFOB vs. colonoscopy.  Risks and benefits of both were discussed and patient voiced understanding.  Pt elects for: colougard.   Living will d/w pt. Husband designated if patient were incapacitated.   Diet and exercise d/w pt.  "I'm doing good."  Intentional weight loss.  I thanked her for her effort.    H/o migraines.  Taking imitrex prn.  No ADE on med.  Improved with cessation of menses.  She uses imitrex early on and that helps.    Mild K elevation that is highly likely to be clinically insignificant.  D/w pt. would not need recheck at this point.  R hand tremor noted in the AM, only R hand.  When putting on makeup. Less noted since out of school and less stressful situation now.    PMH and SH reviewed  Meds, vitals, and allergies reviewed.   ROS: Per HPI.  Unless specifically indicated otherwise in HPI, the patient denies:  General: fever. Eyes: acute vision changes ENT: sore throat Cardiovascular: chest pain Respiratory:  SOB GI: vomiting GU: dysuria Musculoskeletal: acute back pain Derm: acute rash Neuro: acute motor dysfunction Psych: worsening mood Endocrine: polydipsia Heme: bleeding Allergy: hayfever  GEN: nad, alert and oriented HEENT: ncat NECK: supple w/o LA CV: rrr. PULM: ctab, no inc wob ABD: soft, +bs EXT: no edema SKIN: no acute rash Faint R hand tremor on exam but not at rest.  Normal finger to nose B.   CN 2-12 wnl B, S/S wnl x4

## 2020-07-20 NOTE — Patient Instructions (Addendum)
You can call for a mammogram at [facility]  [address]  Let me know if the tremor is more noticeable.  Take care.  Glad to see you.

## 2020-07-22 ENCOUNTER — Telehealth: Payer: Self-pay | Admitting: Family Medicine

## 2020-07-22 DIAGNOSIS — R251 Tremor, unspecified: Secondary | ICD-10-CM | POA: Insufficient documentation

## 2020-07-22 NOTE — Assessment & Plan Note (Signed)
  H/o migraines.  Taking imitrex prn.  No ADE on med.  Improved with cessation of menses.  She uses imitrex early on and that helps.  Would continue Imitrex as needed.

## 2020-07-22 NOTE — Telephone Encounter (Addendum)
Please call patient.  I updated her allergy list.  I would not get the Shingrix vaccine due to potential increased risk of Guillain-Barr syndrome.    I do not see any data about increased risk for her children due to her having history of Guillain-Barr.  Thanks.

## 2020-07-22 NOTE — Assessment & Plan Note (Signed)
Tetanus 2016 Flu not indicated Covid vaccine prev done.   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   PNA not due.   DXA not due.   Mammogram due, d/w pt.  She'll call about an appointment Pap per gyn- I'll defer.   No menses.   D/w patient [id number] for colon cancer screening, including IFOB vs. colonoscopy.  Risks and benefits of both were discussed and patient voiced understanding.  Pt elects for: colougard.   Living will d/w pt. Husband designated if patient were incapacitated.   Diet and exercise d/w pt.  "I'm doing good."  Intentional weight loss.  I thanked her for her effort.

## 2020-07-22 NOTE — Assessment & Plan Note (Addendum)
Minimal.  No parkinsonian features.  Reasonable to observe for now.  She will update me as needed.  She agrees.  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

## 2020-07-22 NOTE — Assessment & Plan Note (Signed)
Living will d/w pt.  Husband designated if patient were incapacitated.  

## 2020-07-23 NOTE — Telephone Encounter (Signed)
Called patient reviewed all information and repeated back to me. Will call if any questions.  ? ?

## 2020-07-24 NOTE — Addendum Note (Signed)
Addended by: Wendie Simmer B on: 07/24/2020 04:33 PM   Modules accepted: Orders

## 2020-08-21 DIAGNOSIS — Z1211 Encounter for screening for malignant neoplasm of colon: Secondary | ICD-10-CM | POA: Diagnosis not present

## 2020-08-26 LAB — EXTERNAL GENERIC LAB PROCEDURE: COLOGUARD: NEGATIVE

## 2020-08-26 LAB — COLOGUARD
COLOGUARD: NEGATIVE
Cologuard: NEGATIVE

## 2020-10-12 ENCOUNTER — Other Ambulatory Visit: Payer: Self-pay | Admitting: Family Medicine

## 2020-10-19 NOTE — Telephone Encounter (Signed)
CPE was on 07/20/20, last filled on 06/13/20 #10 tabs with 3 refills

## 2020-10-19 NOTE — Telephone Encounter (Signed)
Sent. Thanks.   

## 2020-11-09 ENCOUNTER — Telehealth: Payer: BC Managed Care – PPO | Admitting: Physician Assistant

## 2020-11-09 ENCOUNTER — Telehealth: Payer: BC Managed Care – PPO | Admitting: Family Medicine

## 2020-11-09 DIAGNOSIS — R300 Dysuria: Secondary | ICD-10-CM | POA: Diagnosis not present

## 2020-11-09 MED ORDER — CEPHALEXIN 500 MG PO CAPS: 500.0000 mg | ORAL_CAPSULE | Freq: Two times a day (BID) | ORAL | 0 refills | Status: AC

## 2020-11-09 NOTE — Progress Notes (Signed)
Technical error patient resubmitting

## 2020-11-09 NOTE — Progress Notes (Signed)

## 2021-01-27 ENCOUNTER — Telehealth: Payer: BC Managed Care – PPO | Admitting: Physician Assistant

## 2021-01-27 DIAGNOSIS — R3989 Other symptoms and signs involving the genitourinary system: Secondary | ICD-10-CM | POA: Diagnosis not present

## 2021-01-27 MED ORDER — CEPHALEXIN 500 MG PO CAPS: 500.0000 mg | ORAL_CAPSULE | Freq: Two times a day (BID) | ORAL | 0 refills | Status: DC

## 2021-01-27 NOTE — Progress Notes (Signed)

## 2021-01-30 ENCOUNTER — Ambulatory Visit: Payer: BC Managed Care – PPO | Admitting: Nurse Practitioner

## 2021-01-30 ENCOUNTER — Telehealth: Payer: Self-pay

## 2021-01-30 NOTE — Telephone Encounter (Signed)
Patient cancelled. Per appt desk note stated patient is going to urgent care instead

## 2021-01-30 NOTE — Telephone Encounter (Signed)
Ladona Ridgel RN with Access nurse called with pt on phone; pt had evisit and started Keflex on 11/28/20 and pt had had Keflex before but on 01/29/21 pt started with ears itching and turning red, pt had whelp like rash on buttock and itching on feet and elbows. Pt last took keflex on 01/29/21 at 6 PM. No swelling in mouth or throat and no difficulty breathing. There were no available appts at [facility] and pt did not want to go to UC to wait to be seen. Available appt at [facility] came available while on phone with pt today at 10:40 and pt said she could get to [facility] by that time. Scheduled appt with Audria Nine NP today at 10:40. When did covid screening pt is on day 14 after + covid with no symptoms. Pt will be wearing a mask. Sending note to Audria Nine NP and Anastasiya CMA and also teams Anastasiya. Called Anastasiya but no answer. UC and Ed precautions given and pt voiced understanding. Added keflex to pts allergic med list.

## 2021-01-30 NOTE — Telephone Encounter (Signed)
Please see note below this access note where I spoke with pt and she has appt.     [facility] Day - Client TELEPHONE ADVICE RECORD AccessNurse Patient Name: Sherry Mendoza Gender: Female DOB: July 27, 1965 Age: 55 Y 7 M 15 D Return Phone Number: [phone] (Primary), [phone] (Secondary) Address: City/ State/ Zip: [place]  [address] Client [facility] Day - Client Client Site  [facility] - Day Provider Raechel Ache - MD Contact Type Call Who Is Calling Patient / Member / Family / Caregiver Call Type Triage / Clinical Relationship To Patient Self Return Phone Number [phone] (Secondary) Chief Complaint Hives Reason for Call Symptomatic / Request for Health Information Initial Comment Amy with Dr. Para March / transferring call No Appts Today/ this week ............................ patient had evisit for UTI, patient having reaction to Keflex and breaking out on her rear end and her feet/ her ears are itching and hot and red ... 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 If symptomatic, describe symptoms. ---patient had e-visit for UTI, patient having reaction to Keflex and breaking out on in welts on her rear end and her feet/ her ears are itching and hot and red. Rash started this morning. Antibiotic started Monday morning. Denies, difficulty breathing, swallowing, chest pain, blisters, drainage, or any other symptoms at this time. Does the patient have any new or worsening symptoms? ---Yes Will a triage be completed? ---Yes Related visit to physician within the last 2 weeks? ---Yes Does the PT have any chronic conditions? (i.e. diabetes, asthma, this includes High risk factors for pregnancy, etc.) ---No Is this a behavioral health or substance abuse call?  ---No Guidelines Guideline Title Affirmed Question Affirmed Notes Nurse Date/Time Lamount Cohen Time) Rash - Widespread On Drugs Hives or itching [facility] 01/30/2021 9:54:44 AM Disp. Time Lamount Cohen Time) Disposition Final User PLEASE NOTE: All timestamps contained within this report are represented as [place] Standard Time. CONFIDENTIALTY NOTICE: This fax transmission is intended only for the addressee. It contains information that is legally privileged, confidential or otherwise protected from use or disclosure. If you are not the intended recipient, you are strictly prohibited from reviewing, disclosing, copying using or disseminating any of this information or taking any action in reliance on or regarding this information. If you have received this fax in error, please notify us immediately by telephone so that we can arrange for its return to [place]. Phone: [phone], Toll-Free: [phone], Fax: [phone] Page: 2 of 2 Call Id: [id number] 01/30/2021 9:56:23 AM See PCP within 24 Hours Yes Thana Farr, RN, Ursula Beath Disagree/Comply Comply Caller Understands Yes PreDisposition Did not know what to do Care Advice Given Per Guideline * IF OFFICE WILL BE OPEN: You need to be examined within the next 24 hours. Call your doctor (or NP/PA) when the office opens and make an appointment. SEE PCP WITHIN 24 HOURS: * You become worse CALL BACK IF: Comments User: Meade Maw, RN Date/Time (Eastern Time): 01/30/2021 10:03:49 AM Pt requested to speak with doctor's nurse regarding antibiotics being changed. Pt warm transferred to RN on back line. Referrals REFERRED TO PCP OFFIC

## 2021-05-24 IMAGING — DX DG KNEE COMPLETE 4+V*R*
4 series · 4 of 4 positions shown · non-contrast
Comparison: None.

CLINICAL DATA: Right knee pain.

EXAM:
RIGHT KNEE - COMPLETE 4+ VIEW

[knee ap]
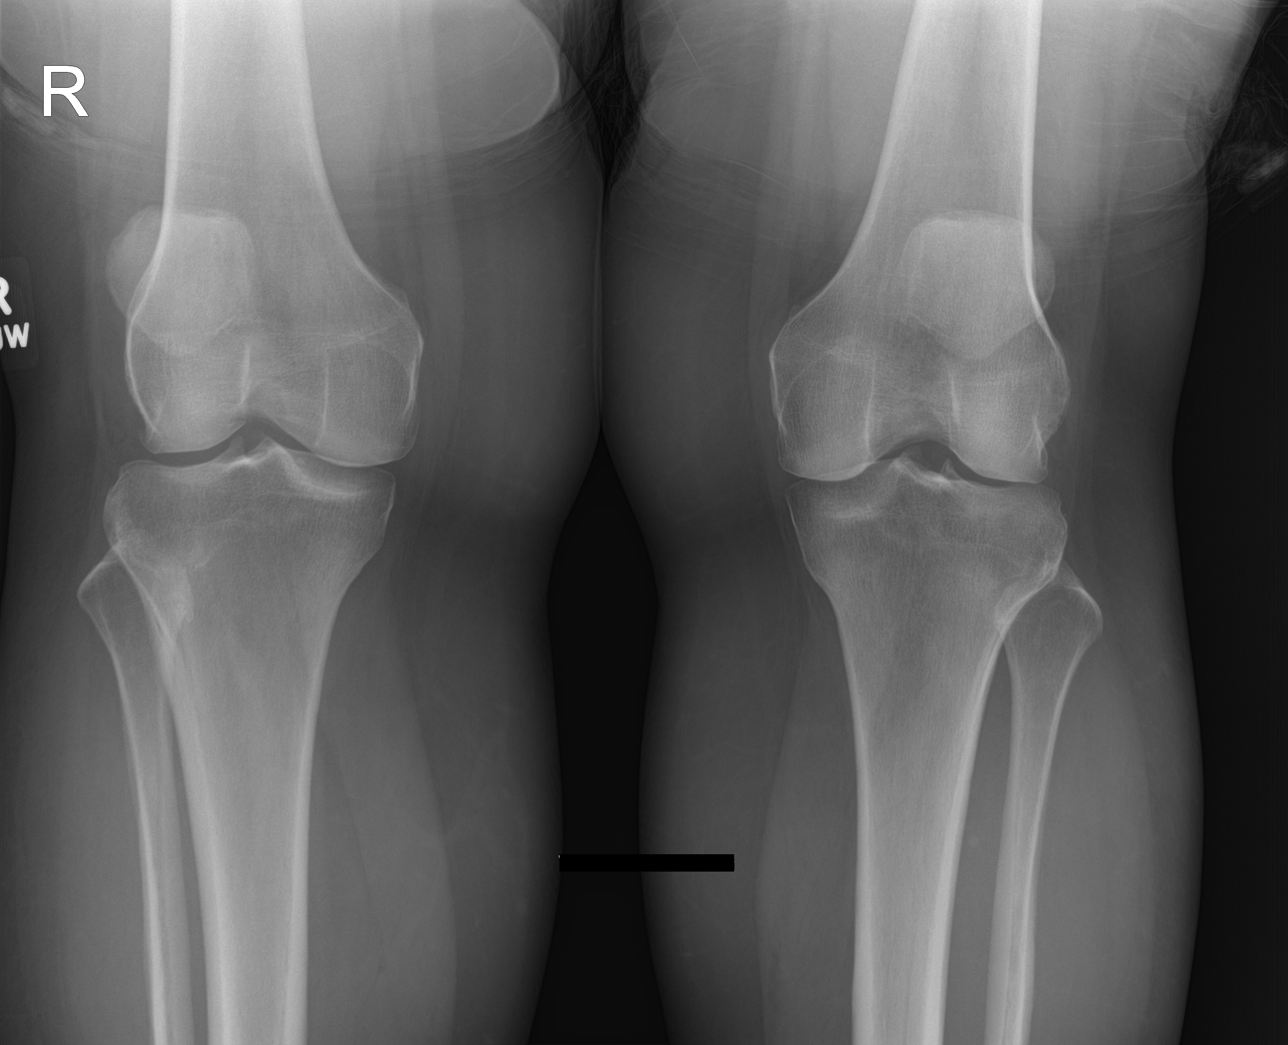

[knee tunnel]
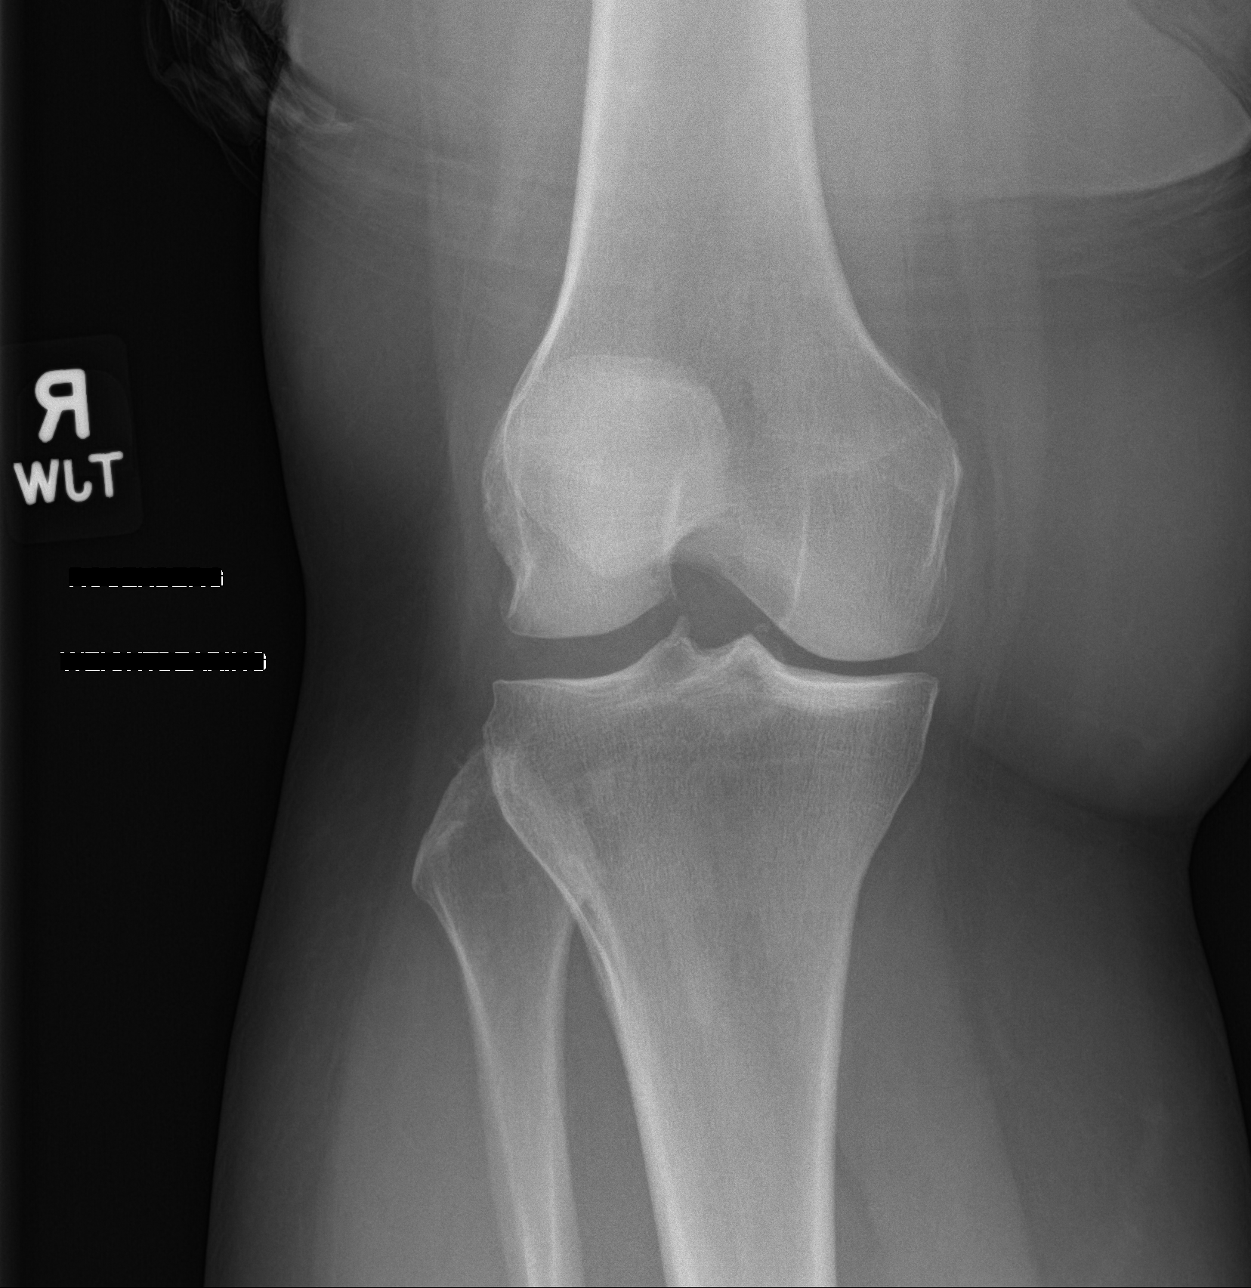

[knee lat]
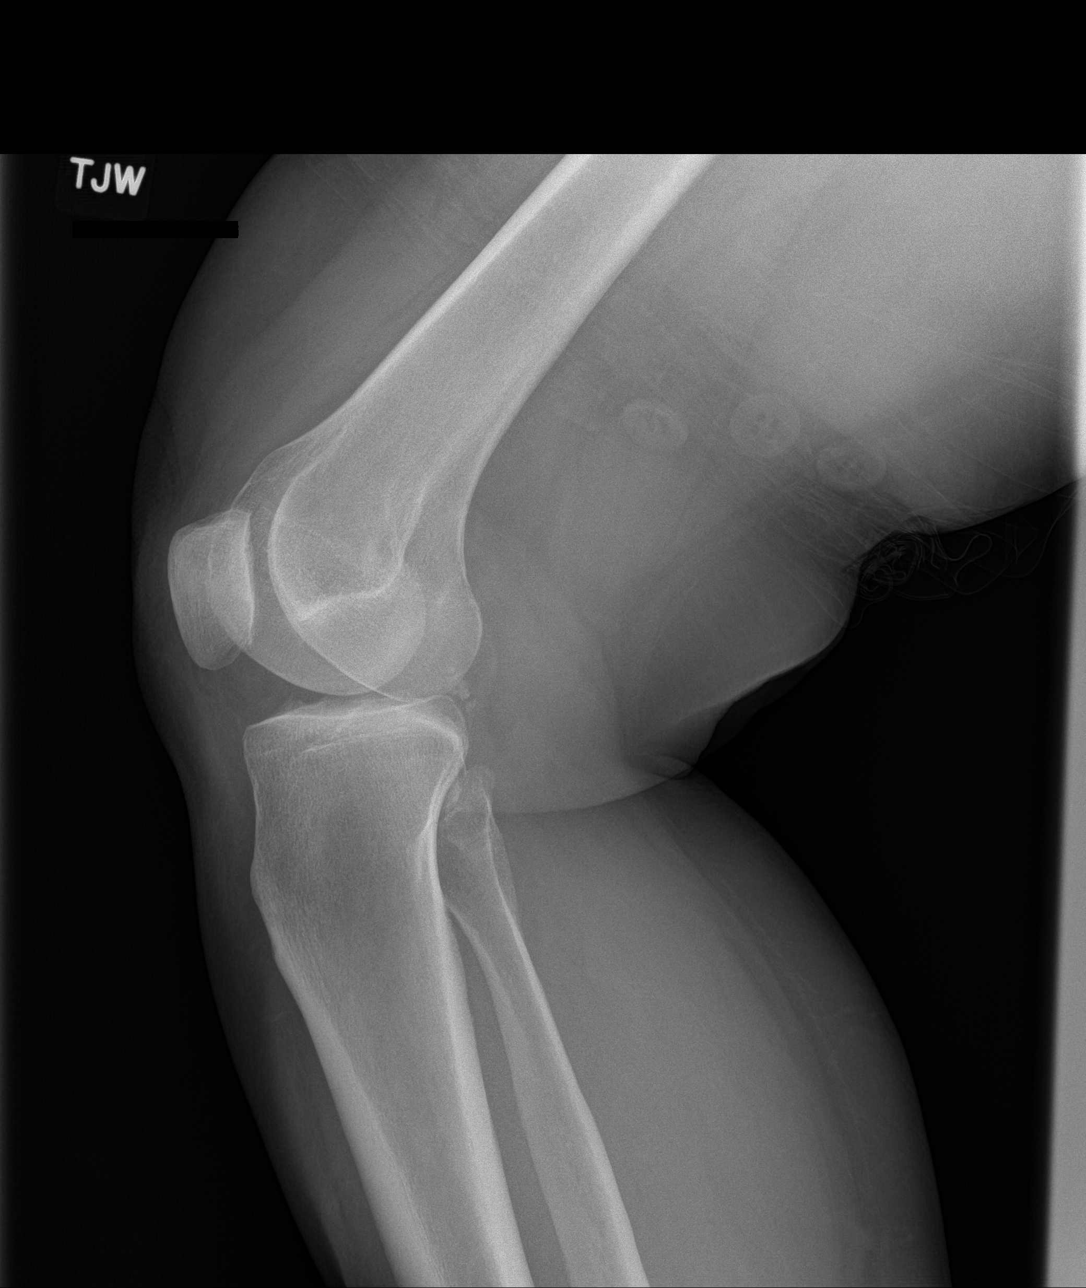

[patella skyline]
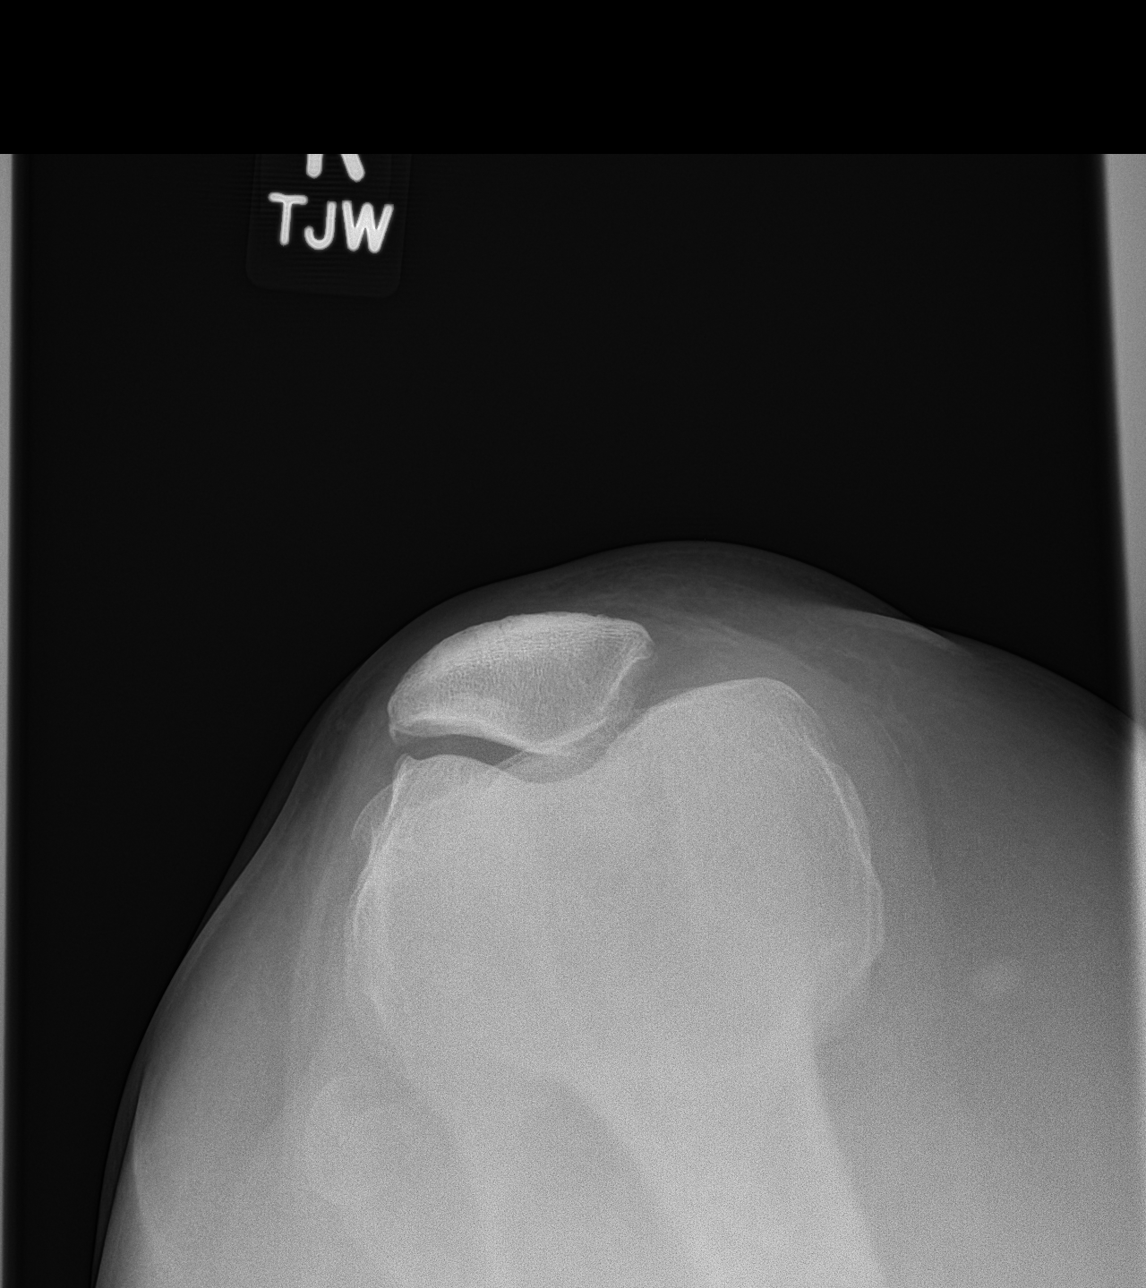

[4 of 4 positions shown; findings below may reference images not displayed]

FINDINGS: No evidence of fracture, dislocation, or joint effusion. Moderate
narrowing of the medial joint space is noted with sclerosis of the
medial tibial plateau surface. Soft tissues are unremarkable.
IMPRESSION: Moderate degenerative joint disease is noted medially. No acute
abnormality seen in the right knee.

## 2021-10-10 ENCOUNTER — Telehealth: Payer: BC Managed Care – PPO | Admitting: Physician Assistant

## 2021-10-10 DIAGNOSIS — R3989 Other symptoms and signs involving the genitourinary system: Secondary | ICD-10-CM | POA: Diagnosis not present

## 2021-10-10 MED ORDER — CIPROFLOXACIN HCL 500 MG PO TABS: 500.0000 mg | ORAL_TABLET | Freq: Two times a day (BID) | ORAL | 0 refills | Status: AC

## 2021-10-10 NOTE — Progress Notes (Signed)
Message sent to patient requesting further input regarding current symptoms. Awaiting patient response.  

## 2021-10-10 NOTE — Progress Notes (Signed)
E-Visit for Urinary Problems  We are sorry that you are not feeling well.  Here is how we plan to help!  Based on what you shared with me it looks like you most likely have a simple urinary tract infection.  A UTI (Urinary Tract Infection) is a bacterial infection of the bladder.  Most cases of urinary tract infections are simple to treat but a key part of your care is to encourage you to drink plenty of fluids and watch your symptoms carefully.  I have prescribed Cipro 500 mg to take twice daily for 5 days giving your other antibiotic allergies.  Your symptoms should gradually improve. Call us if the burning in your urine worsens, you develop worsening fever, back pain or pelvic pain or if your symptoms do not resolve after completing the antibiotic.  Urinary tract infections can be prevented by drinking plenty of water to keep your body hydrated.  Also be sure when you wipe, wipe from front to back and don't hold it in!  If possible, empty your bladder every 4 hours.  HOME CARE Drink plenty of fluids Compete the full course of the antibiotics even if the symptoms resolve Remember, when you need to go.go. Holding in your urine can increase the likelihood of getting a UTI! GET HELP RIGHT AWAY IF: You cannot urinate You get a high fever Worsening back pain occurs You see blood in your urine You feel sick to your stomach or throw up You feel like you are going to pass out  MAKE SURE YOU  Understand these instructions. Will watch your condition. Will get help right away if you are not doing well or get worse.   Thank you for choosing an e-visit.  Your e-visit answers were reviewed by a board certified advanced clinical practitioner to complete your personal care plan. Depending upon the condition, your plan could have included both over the counter or prescription medications.  Please review your pharmacy choice. Make sure the pharmacy is open so you can pick up prescription now. If  there is a problem, you may contact your provider through [employer] and have the prescription routed to another pharmacy.  Your safety is important to [place]. If you have drug allergies check your prescription carefully.   For the next 24 hours you can use MyChart to ask questions about today's visit, request a non-urgent call back, or ask for a work or school excuse. You will get an email in the next two days asking about your experience. I hope that your e-visit has been valuable and will speed your recovery.

## 2021-10-10 NOTE — Progress Notes (Signed)
I have spent 5 minutes in review of e-visit questionnaire, review and updating patient chart, medical decision making and response to patient.   Ranika Mcniel Cody Glema Takaki, PA-C    

## 2021-12-02 ENCOUNTER — Telehealth: Payer: BC Managed Care – PPO | Admitting: Nurse Practitioner

## 2021-12-02 DIAGNOSIS — J069 Acute upper respiratory infection, unspecified: Secondary | ICD-10-CM

## 2021-12-02 MED ORDER — FLUTICASONE PROPIONATE 50 MCG/ACT NA SUSP: 2.0000 | Freq: Every day | NASAL | 6 refills | Status: DC

## 2021-12-02 NOTE — Progress Notes (Signed)
E-Visit for Sinus Problems  We are sorry that you are not feeling well.  Here is how we plan to help!  Based on what you have shared with me it looks like you have sinusitis.  Sinusitis is inflammation and infection in the sinus cavities of the head.  Based on your presentation I believe you most likely have Acute Viral Sinusitis.This is an infection most likely caused by a virus. You may want to consider taking a home COVID test as sinus symptoms have been present with recent variants of COVID-19.  There is not specific treatment for viral sinusitis other than to help you with the symptoms until the infection runs its course.  You may use an oral decongestant such as Mucinex D or if you have glaucoma or high blood pressure use plain Mucinex. Saline nasal spray help and can safely be used as often as needed for congestion, I have prescribed: Fluticasone nasal spray two sprays in each nostril once a day  Some authorities believe that zinc sprays or the use of Echinacea may shorten the course of your symptoms.  Sinus infections are not as easily transmitted as other respiratory infection, however we still recommend that you avoid close contact with loved ones, especially the very young and elderly.  Remember to wash your hands thoroughly throughout the day as this is the number one way to prevent the spread of infection!  Home Care: Only take medications as instructed by your medical team. Do not take these medications with alcohol. A steam or ultrasonic humidifier can help congestion.  You can place a towel over your head and breathe in the steam from hot water coming from a faucet. Avoid close contacts especially the very young and the elderly. Cover your mouth when you cough or sneeze. Always remember to wash your hands.  Get Help Right Away If: You develop worsening fever or sinus pain. You develop a severe head ache or visual changes. Your symptoms persist after you have completed your  treatment plan.  Make sure you Understand these instructions. Will watch your condition. Will get help right away if you are not doing well or get worse.   Thank you for choosing an e-visit.  Your e-visit answers were reviewed by a board certified advanced clinical practitioner to complete your personal care plan. Depending upon the condition, your plan could have included both over the counter or prescription medications.  Please review your pharmacy choice. Make sure the pharmacy is open so you can pick up prescription now. If there is a problem, you may contact your provider through [employer] and have the prescription routed to another pharmacy.  Your safety is important to [place]. If you have drug allergies check your prescription carefully.   For the next 24 hours you can use MyChart to ask questions about today's visit, request a non-urgent call back, or ask for a work or school excuse. You will get an email in the next two days asking about your experience. I hope that your e-visit has been valuable and will speed your recovery.  Meds ordered this encounter  Medications   fluticasone (FLONASE) 50 MCG/ACT nasal spray    Sig: Place 2 sprays into both nostrils daily.    Dispense:  16 g    Refill:  6     I spent approximately 5 minutes reviewing the patient's history, current symptoms and coordinating their plan of care today.

## 2022-02-13 ENCOUNTER — Encounter: Payer: Self-pay | Admitting: Family Medicine

## 2022-03-15 ENCOUNTER — Other Ambulatory Visit: Payer: Self-pay | Admitting: Family Medicine

## 2022-03-17 NOTE — Telephone Encounter (Signed)
Lvmtcb, sent mychart message

## 2022-03-17 NOTE — Telephone Encounter (Signed)
Patient is past due for appt; please call to schedule CPE

## 2022-03-17 NOTE — Telephone Encounter (Signed)
Refill request for SUMAtriptan (IMITREX) 100 MG tablet   LOV - 07/20/20 Next OV - not scheduled Last refill - 10/19/20 #10/5

## 2022-03-17 NOTE — Telephone Encounter (Signed)
Sent. Thanks.   

## 2022-03-26 ENCOUNTER — Other Ambulatory Visit: Payer: Self-pay | Admitting: Family Medicine

## 2022-03-26 DIAGNOSIS — Z789 Other specified health status: Secondary | ICD-10-CM

## 2022-03-26 DIAGNOSIS — G43009 Migraine without aura, not intractable, without status migrainosus: Secondary | ICD-10-CM

## 2022-04-03 ENCOUNTER — Other Ambulatory Visit (INDEPENDENT_AMBULATORY_CARE_PROVIDER_SITE_OTHER): Payer: BC Managed Care – PPO

## 2022-04-03 DIAGNOSIS — G43009 Migraine without aura, not intractable, without status migrainosus: Secondary | ICD-10-CM

## 2022-04-03 DIAGNOSIS — Z789 Other specified health status: Secondary | ICD-10-CM | POA: Diagnosis not present

## 2022-04-03 LAB — CBC WITH DIFFERENTIAL/PLATELET
Basophils Absolute: 0 10*3/uL (ref 0.0–0.1)
Basophils Relative: 0.7 % (ref 0.0–3.0)
Eosinophils Absolute: 0.1 10*3/uL (ref 0.0–0.7)
Eosinophils Relative: 1.5 % (ref 0.0–5.0)
HCT: 38.4 % (ref 36.0–46.0)
Hemoglobin: 12.7 g/dL (ref 12.0–15.0)
Lymphocytes Relative: 33.3 % (ref 12.0–46.0)
Lymphs Abs: 2 10*3/uL (ref 0.7–4.0)
MCHC: 33.1 g/dL (ref 30.0–36.0)
MCV: 86.9 fl (ref 78.0–100.0)
Monocytes Absolute: 0.4 10*3/uL (ref 0.1–1.0)
Monocytes Relative: 7 % (ref 3.0–12.0)
Neutro Abs: 3.4 10*3/uL (ref 1.4–7.7)
Neutrophils Relative %: 57.5 % (ref 43.0–77.0)
Platelets: 274 10*3/uL (ref 150.0–400.0)
RBC: 4.41 Mil/uL (ref 3.87–5.11)
RDW: 12.8 % (ref 11.5–15.5)
WBC: 5.9 10*3/uL (ref 4.0–10.5)

## 2022-04-03 LAB — COMPREHENSIVE METABOLIC PANEL
ALT: 24 U/L (ref 0–35)
AST: 21 U/L (ref 0–37)
Albumin: 4 g/dL (ref 3.5–5.2)
Alkaline Phosphatase: 48 U/L (ref 39–117)
BUN: 13 mg/dL (ref 6–23)
CO2: 29 mEq/L (ref 19–32)
Calcium: 9.8 mg/dL (ref 8.4–10.5)
Chloride: 101 mEq/L (ref 96–112)
Creatinine, Ser: 0.73 mg/dL (ref 0.40–1.20)
GFR: 90.41 mL/min (ref >60.00)
Glucose, Bld: 81 mg/dL (ref 70–99)
Potassium: 4.7 mEq/L (ref 3.5–5.1)
Sodium: 138 mEq/L (ref 135–145)
Total Bilirubin: 0.4 mg/dL (ref 0.2–1.2)
Total Protein: 6 g/dL (ref 6.0–8.3)

## 2022-04-03 LAB — LIPID PANEL
Cholesterol: 182 mg/dL (ref 0–200)
HDL: 64.3 mg/dL (ref >39.00)
LDL Cholesterol: 106 mg/dL — ABNORMAL HIGH (ref 0–99)
NonHDL: 117.95
Total CHOL/HDL Ratio: 3
Triglycerides: 58 mg/dL (ref 0.0–149.0)
VLDL: 11.6 mg/dL (ref 0.0–40.0)

## 2022-04-03 LAB — TSH: TSH: 2.19 u[IU]/mL (ref 0.35–5.50)

## 2022-04-05 ENCOUNTER — Telehealth: Payer: BC Managed Care – PPO | Admitting: Family Medicine

## 2022-04-05 DIAGNOSIS — R3989 Other symptoms and signs involving the genitourinary system: Secondary | ICD-10-CM

## 2022-04-05 MED ORDER — CIPROFLOXACIN HCL 500 MG PO TABS: 500.0000 mg | ORAL_TABLET | Freq: Two times a day (BID) | ORAL | 0 refills | Status: AC

## 2022-04-05 NOTE — Progress Notes (Signed)
E-Visit for Urinary Problems  We are sorry that you are not feeling well.  Here is how we plan to help!  Based on what you shared with me it looks like you most likely have a simple urinary tract infection.  A UTI (Urinary Tract Infection) is a bacterial infection of the bladder.  Most cases of urinary tract infections are simple to treat but a key part of your care is to encourage you to drink plenty of fluids and watch your symptoms carefully.  I have prescribed cipro due to your other allergies. .  Your symptoms should gradually improve. Call us if the burning in your urine worsens, you develop worsening fever, back pain or pelvic pain or if your symptoms do not resolve after completing the antibiotic.  Urinary tract infections can be prevented by drinking plenty of water to keep your body hydrated.  Also be sure when you wipe, wipe from front to back and don't hold it in!  If possible, empty your bladder every 4 hours.  HOME CARE Drink plenty of fluids Compete the full course of the antibiotics even if the symptoms resolve Remember, when you need to go.go. Holding in your urine can increase the likelihood of getting a UTI! GET HELP RIGHT AWAY IF: You cannot urinate You get a high fever Worsening back pain occurs You see blood in your urine You feel sick to your stomach or throw up You feel like you are going to pass out  MAKE SURE YOU  Understand these instructions. Will watch your condition. Will get help right away if you are not doing well or get worse.   Thank you for choosing an e-visit.  Your e-visit answers were reviewed by a board certified advanced clinical practitioner to complete your personal care plan. Depending upon the condition, your plan could have included both over the counter or prescription medications.  Please review your pharmacy choice. Make sure the pharmacy is open so you can pick up prescription now. If there is a problem, you may contact your  provider through [employer] and have the prescription routed to another pharmacy.  Your safety is important to [place]. If you have drug allergies check your prescription carefully.   For the next 24 hours you can use MyChart to ask questions about today's visit, request a non-urgent call back, or ask for a work or school excuse. You will get an email in the next two days asking about your experience. I hope that your e-visit has been valuable and will speed your recovery.     have provided 5 minutes of non face to face time during this encounter for chart review and documentation.

## 2022-04-10 ENCOUNTER — Encounter: Payer: Self-pay | Admitting: Family Medicine

## 2022-04-10 ENCOUNTER — Ambulatory Visit (INDEPENDENT_AMBULATORY_CARE_PROVIDER_SITE_OTHER): Payer: BC Managed Care – PPO | Admitting: Family Medicine

## 2022-04-10 VITALS — BP 120/72 | HR 66 | Temp 98.2°F | Ht 65.0 in | Wt 174.0 lb

## 2022-04-10 DIAGNOSIS — G43009 Migraine without aura, not intractable, without status migrainosus: Secondary | ICD-10-CM

## 2022-04-10 DIAGNOSIS — Z0000 Encounter for general adult medical examination without abnormal findings: Secondary | ICD-10-CM

## 2022-04-10 DIAGNOSIS — Z7189 Other specified counseling: Secondary | ICD-10-CM

## 2022-04-10 DIAGNOSIS — R300 Dysuria: Secondary | ICD-10-CM

## 2022-04-10 DIAGNOSIS — Z01419 Encounter for gynecological examination (general) (routine) without abnormal findings: Secondary | ICD-10-CM

## 2022-04-10 DIAGNOSIS — R251 Tremor, unspecified: Secondary | ICD-10-CM

## 2022-04-10 LAB — POC URINALSYSI DIPSTICK (AUTOMATED)
Bilirubin, UA: NEGATIVE
Blood, UA: NEGATIVE
Glucose, UA: NEGATIVE
Ketones, UA: NEGATIVE
Leukocytes, UA: NEGATIVE
Nitrite, UA: NEGATIVE
Protein, UA: NEGATIVE
Spec Grav, UA: 1.015 (ref 1.010–1.025)
Urobilinogen, UA: 0.2 E.U./dL
pH, UA: 6 (ref 5.0–8.0)

## 2022-04-10 NOTE — Patient Instructions (Addendum)
Let me know if you don't get a call about seeing gynecology.  Take care.  Glad to see you. If the tremor is worse, then let me know.    You can call for a mammogram at [facility]  [place]

## 2022-04-10 NOTE — Progress Notes (Signed)
CPE- See plan.  Routine anticipatory guidance given to patient.  See health maintenance.  The possibility exists that previously documented standard health maintenance information may have been brought forward from a previous encounter into this note.  If needed, that same information has been updated to reflect the current situation based on today's encounter.    Tetanus 2016 Flu not indicated Covid vaccine prev done.   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   PNA not due.   DXA not due.   Mammogram due, d/w pt.   Pap per gyn- refer 2024.   No menses.   Colougard neg 2022.   Living will d/w pt. Husband designated if patient were incapacitated.   Diet and exercise d/w pt.   R hand tremor noted.  Minimally worse from last year.  Swallowing well.  D/w pt about caffeine use- daily.  Sx may be worse under high stress times.  No L hand or B foot sx.  Discussed options for observation vs referral.  Wouldn't need or want extra tx at this point.  Father is chronically ill, d/w pt.  He is living at home.  He may need extra/escalating care.  She is working to get him a med alert button.  She is trying to care for her parents.  Discussed options and she will update me as needed.  U/a unremarkable. D/w pt at OV.  Done with cipro.  She felt some better as of last night.    Migraine d/w pt.  Using imitrex prn.  It helps, used about 3 per month.  Routine cautions d/w pt.     PMH and SH reviewed  Meds, vitals, and allergies reviewed.   ROS: Per HPI.  Unless specifically indicated otherwise in HPI, the patient denies:  General: fever. Eyes: acute vision changes ENT: sore throat Cardiovascular: chest pain Respiratory: SOB GI: vomiting GU: dysuria Musculoskeletal: acute back pain Derm: acute rash Neuro: acute motor dysfunction Psych: worsening mood Endocrine: polydipsia Heme: bleeding Allergy: hayfever  GEN: nad, alert and oriented HEENT: ncat NECK: supple w/o LA CV: rrr. PULM: ctab,  no inc wob ABD: soft, +bs EXT: no edema SKIN: no acute rash R hand tremor, but not resting tremor.

## 2022-04-13 DIAGNOSIS — R300 Dysuria: Secondary | ICD-10-CM | POA: Insufficient documentation

## 2022-04-13 NOTE — Assessment & Plan Note (Signed)
History of. U/a unremarkable. D/w pt at OV.  Done with cipro.  She felt some better as of last night.  She will update me as needed.  And observation for now.  Continue with liberal p.o. fluids.

## 2022-04-13 NOTE — Assessment & Plan Note (Signed)
Continue as needed Imitrex.  Update me as needed.

## 2022-04-13 NOTE — Assessment & Plan Note (Signed)
Tetanus 2016 Flu not indicated Covid vaccine prev done.   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   PNA not due.   DXA not due.   Mammogram due, d/w pt.   Pap per gyn- refer 2024.   No menses.   [place] neg 2022.   Living will d/w pt. Husband designated if patient were incapacitated.   Diet and exercise d/w pt.

## 2022-04-13 NOTE — Assessment & Plan Note (Signed)
R hand tremor noted.  Minimally worse from last year.  Swallowing well.  D/w pt about caffeine use- daily.  Sx may be worse under high stress times.  No L hand or B foot sx.  Discussed options for observation vs referral.  Wouldn't need or want extra tx at this point.  Continue observation for now.  She can update me as needed.

## 2022-04-13 NOTE — Assessment & Plan Note (Signed)
Living will d/w pt.  Husband designated if patient were incapacitated.  

## 2022-07-17 ENCOUNTER — Telehealth: Payer: BC Managed Care – PPO | Admitting: Physician Assistant

## 2022-07-17 DIAGNOSIS — R3989 Other symptoms and signs involving the genitourinary system: Secondary | ICD-10-CM

## 2022-07-17 MED ORDER — CIPROFLOXACIN HCL 500 MG PO TABS: 500.0000 mg | ORAL_TABLET | Freq: Two times a day (BID) | ORAL | 0 refills | Status: AC

## 2022-07-17 NOTE — Progress Notes (Signed)
I have spent 5 minutes in review of e-visit questionnaire, review and updating patient chart, medical decision making and response to patient.   Aleksia Freiman Cody Ayleen Mckinstry, PA-C    

## 2022-07-17 NOTE — Progress Notes (Signed)
E-Visit for Urinary Problems  We are sorry that you are not feeling well.  Here is how we plan to help!  Based on what you shared with me it looks like you most likely have a simple urinary tract infection.  A UTI (Urinary Tract Infection) is a bacterial infection of the bladder.  Most cases of urinary tract infections are simple to treat but a key part of your care is to encourage you to drink plenty of fluids and watch your symptoms carefully.  I have prescribed Cipro 500 mg twice daily for 5 days.  Your symptoms should gradually improve. Call us if the burning in your urine worsens, you develop worsening fever, back pain or pelvic pain or if your symptoms do not resolve after completing the antibiotic.  Urinary tract infections can be prevented by drinking plenty of water to keep your body hydrated.  Also be sure when you wipe, wipe from front to back and don't hold it in!  If possible, empty your bladder every 4 hours.  HOME CARE Drink plenty of fluids Compete the full course of the antibiotics even if the symptoms resolve Remember, when you need to go.go. Holding in your urine can increase the likelihood of getting a UTI! GET HELP RIGHT AWAY IF: You cannot urinate You get a high fever Worsening back pain occurs You see blood in your urine You feel sick to your stomach or throw up You feel like you are going to pass out  MAKE SURE YOU  Understand these instructions. Will watch your condition. Will get help right away if you are not doing well or get worse.   Thank you for choosing an e-visit.  Your e-visit answers were reviewed by a board certified advanced clinical practitioner to complete your personal care plan. Depending upon the condition, your plan could have included both over the counter or prescription medications.  Please review your pharmacy choice. Make sure the pharmacy is open so you can pick up prescription now. If there is a problem, you may contact your  provider through [employer] and have the prescription routed to another pharmacy.  Your safety is important to [place]. If you have drug allergies check your prescription carefully.   For the next 24 hours you can use MyChart to ask questions about today's visit, request a non-urgent call back, or ask for a work or school excuse. You will get an email in the next two days asking about your experience. I hope that your e-visit has been valuable and will speed your recovery.

## 2022-07-30 ENCOUNTER — Other Ambulatory Visit (HOSPITAL_COMMUNITY)
Admission: RE | Admit: 2022-07-30 | Discharge: 2022-07-30 | Disposition: A | Discharge status: Home / Self Care | Payer: BC Managed Care – PPO | Source: Ambulatory Visit | Attending: Family Medicine | Admitting: Family Medicine

## 2022-07-30 ENCOUNTER — Ambulatory Visit (INDEPENDENT_AMBULATORY_CARE_PROVIDER_SITE_OTHER): Payer: BC Managed Care – PPO | Admitting: Family Medicine

## 2022-07-30 ENCOUNTER — Encounter: Payer: Self-pay | Admitting: Family Medicine

## 2022-07-30 VITALS — BP 133/83 | HR 57 | Ht 65.0 in | Wt 173.0 lb

## 2022-07-30 DIAGNOSIS — R1031 Right lower quadrant pain: Secondary | ICD-10-CM

## 2022-07-30 DIAGNOSIS — Z124 Encounter for screening for malignant neoplasm of cervix: Secondary | ICD-10-CM | POA: Diagnosis not present

## 2022-07-30 DIAGNOSIS — Z1231 Encounter for screening mammogram for malignant neoplasm of breast: Secondary | ICD-10-CM | POA: Diagnosis not present

## 2022-07-30 DIAGNOSIS — Z01419 Encounter for gynecological examination (general) (routine) without abnormal findings: Secondary | ICD-10-CM | POA: Diagnosis not present

## 2022-07-30 DIAGNOSIS — G8929 Other chronic pain: Secondary | ICD-10-CM

## 2022-07-30 NOTE — Progress Notes (Signed)
Subjective:     Sherry Mendoza is a 57 y.o. female and is here for a comprehensive physical exam. The patient reports no problems. She is menopausal for about 5 years. Has occasional night sweats. She has h/o abnormal paps and had a leep about 5 years ago as well. Paps since that time have been normal. Reports right sided pelvic pain which is sporadic and is low.  The following portions of the patient's history were reviewed and updated as appropriate: allergies, current medications, past family history, past medical history, past social history, past surgical history, and problem list.  Review of Systems Pertinent items noted in HPI and remainder of comprehensive ROS otherwise negative.   Objective:  Chaperone present for exam   BP 133/83   Pulse (!) 57   Ht 5\' 5"  (1.651 m)   Wt 173 lb (78.5 kg)   LMP 07/22/2017 (Exact Date)   BMI 28.79 kg/m  General appearance: alert, cooperative, and appears stated age Head: Normocephalic, without obvious abnormality, atraumatic Neck: no adenopathy, supple, symmetrical, trachea midline, and thyroid not enlarged, symmetric, no tenderness/mass/nodules Lungs: clear to auscultation bilaterally Breasts: normal appearance, no masses or tenderness Heart: regular rate and rhythm, S1, S2 normal, no murmur, click, rub or gallop Abdomen: soft, non-tender; bowel sounds normal; no masses,  no organomegaly Pelvic: cervix normal in appearance, external genitalia normal, no adnexal masses or tenderness, no cervical motion tenderness, uterus normal size, shape, and consistency, and vagina normal without discharge Extremities: extremities normal, atraumatic, no cyanosis or edema Pulses: 2+ and symmetric Skin: Skin color, texture, turgor normal. No rashes or lesions Lymph nodes: Cervical, supraclavicular, and axillary nodes normal. Neurologic: Grossly normal    Assessment:    Healthy female exam.      Plan:  Screening for malignant neoplasm of cervix - Plan:  Cytology - PAP  Encounter for gynecological examination without abnormal finding  Encounter for screening mammogram for malignant neoplasm of breast - Plan: MM 3D SCREENING MAMMOGRAM BILATERAL BREAST  Chronic RLQ pain - check u/s and manage accordingly - Plan: US PELVIC COMPLETE WITH TRANSVAGINAL    See After Visit Summary for Counseling Recommendations

## 2022-07-30 NOTE — Progress Notes (Signed)
Patient presents for Annual.  Last pap:  11/30/2019 Contraception: Post-menopausal Mammogram: Due, last mammogram: 10 yrs ago per pt  pt will like to go in [employer] STD Screening: Declines Flu Vaccine : N/A  CC: Annual/None  Fun Fact: Patient loves to read in the summer and garden flowers.

## 2022-08-01 LAB — CYTOLOGY - PAP
Comment: NEGATIVE
Diagnosis: NEGATIVE
High risk HPV: NEGATIVE

## 2022-08-06 ENCOUNTER — Ambulatory Visit
Admission: RE | Admit: 2022-08-06 | Discharge: 2022-08-06 | Disposition: A | Discharge status: Home / Self Care | Payer: BC Managed Care – PPO | Source: Ambulatory Visit | Attending: Family Medicine | Admitting: Family Medicine

## 2022-08-06 DIAGNOSIS — Z1231 Encounter for screening mammogram for malignant neoplasm of breast: Secondary | ICD-10-CM | POA: Diagnosis not present

## 2022-08-06 DIAGNOSIS — R1031 Right lower quadrant pain: Secondary | ICD-10-CM | POA: Diagnosis not present

## 2022-08-06 DIAGNOSIS — G8929 Other chronic pain: Secondary | ICD-10-CM

## 2022-08-28 ENCOUNTER — Other Ambulatory Visit: Payer: Self-pay | Admitting: Family Medicine

## 2022-11-07 ENCOUNTER — Encounter: Payer: Self-pay | Admitting: Family Medicine

## 2022-11-07 ENCOUNTER — Telehealth: Payer: Self-pay | Admitting: Family Medicine

## 2022-11-07 MED ORDER — DOXYCYCLINE HYCLATE 100 MG PO TABS: 100.0000 mg | ORAL_TABLET | Freq: Two times a day (BID) | ORAL | 0 refills | Status: DC

## 2022-11-07 MED ORDER — BENZONATATE 200 MG PO CAPS: 200.0000 mg | ORAL_CAPSULE | Freq: Three times a day (TID) | ORAL | 1 refills | Status: DC | PRN

## 2022-11-07 NOTE — Telephone Encounter (Signed)
Would use tessalon for the cough and if not getting better then start doxy.  I sent both.  I hope she feels better and I will be thinking about her and her family.   Thanks.

## 2022-11-07 NOTE — Telephone Encounter (Signed)
Spoke with patient's husband and he stated that patient has had cough, fatigue and congestion. Patient has brown mucus when she gets it out. Has had sx for 4 days and has been taking robitussin otc. Patient has dads funeral today and can not be seen right now. Can something be sent in?

## 2022-11-07 NOTE — Telephone Encounter (Signed)
Patient husband called in and stated that patients dad passed away and is dealing with a cold. He stated that his service today. They are wanting a callback in regards to this and some medication. He can be reached at [phone]. Thank you!

## 2022-11-07 NOTE — Addendum Note (Signed)
Addended by: Joaquim Nam on: 11/07/2022 02:04 PM   Modules accepted: Orders

## 2022-11-07 NOTE — Telephone Encounter (Signed)
Spoke with patient and notified her that rxs have been sent in.

## 2022-12-02 ENCOUNTER — Other Ambulatory Visit: Payer: Self-pay | Admitting: Family Medicine

## 2022-12-02 DIAGNOSIS — G43009 Migraine without aura, not intractable, without status migrainosus: Secondary | ICD-10-CM

## 2022-12-02 NOTE — Telephone Encounter (Signed)
Prescription Request  12/02/2022  LOV: 04/10/2022  What is the name of the medication or equipment? SUMAtriptan (IMITREX) 100 MG tablet   Have you contacted your pharmacy to request a refill? No   Which pharmacy would you like this sent to?  [id number] [place], [place] - [address] [place] [address] Phone: [phone] Fax: [phone]     Patient notified that their request is being sent to the clinical staff for review and that they should receive a response within 2 business days.   Please advise at Mobile [phone] (mobile)

## 2022-12-02 NOTE — Telephone Encounter (Signed)
Sumatriptan Last rx:  08/28/22, #10 Last OV:  04/10/22, CPE  Next OV:  none

## 2022-12-03 MED ORDER — SUMATRIPTAN SUCCINATE 100 MG PO TABS
ORAL_TABLET | ORAL | 1 refills | Status: DC
Start: 2022-12-03 — End: 2022-12-30

## 2022-12-29 ENCOUNTER — Other Ambulatory Visit: Payer: Self-pay | Admitting: Family Medicine

## 2022-12-29 DIAGNOSIS — G43009 Migraine without aura, not intractable, without status migrainosus: Secondary | ICD-10-CM

## 2023-03-06 ENCOUNTER — Other Ambulatory Visit: Payer: Self-pay | Admitting: Family Medicine

## 2023-03-06 DIAGNOSIS — G43009 Migraine without aura, not intractable, without status migrainosus: Secondary | ICD-10-CM

## 2023-03-06 NOTE — Telephone Encounter (Signed)
 Call pt and schedule a appt for cpe

## 2023-03-06 NOTE — Telephone Encounter (Signed)
Pt needs CPE with Dr Para March after 04-10-23. Thanks

## 2023-03-29 ENCOUNTER — Other Ambulatory Visit: Payer: Self-pay | Admitting: Family Medicine

## 2023-03-29 DIAGNOSIS — Z789 Other specified health status: Secondary | ICD-10-CM

## 2023-04-03 ENCOUNTER — Other Ambulatory Visit: Payer: BC Managed Care – PPO

## 2023-04-03 DIAGNOSIS — Z789 Other specified health status: Secondary | ICD-10-CM | POA: Diagnosis not present

## 2023-04-03 LAB — COMPREHENSIVE METABOLIC PANEL
ALT: 24 U/L (ref 0–35)
AST: 21 U/L (ref 0–37)
Albumin: 4.5 g/dL (ref 3.5–5.2)
Alkaline Phosphatase: 48 U/L (ref 39–117)
BUN: 15 mg/dL (ref 6–23)
CO2: 29 meq/L (ref 19–32)
Calcium: 9.7 mg/dL (ref 8.4–10.5)
Chloride: 103 meq/L (ref 96–112)
Creatinine, Ser: 0.81 mg/dL (ref 0.40–1.20)
GFR: 79.25 mL/min (ref >60.00)
Glucose, Bld: 79 mg/dL (ref 70–99)
Potassium: 4.6 meq/L (ref 3.5–5.1)
Sodium: 139 meq/L (ref 135–145)
Total Bilirubin: 0.6 mg/dL (ref 0.2–1.2)
Total Protein: 6.7 g/dL (ref 6.0–8.3)

## 2023-04-03 LAB — CBC WITH DIFFERENTIAL/PLATELET
Basophils Absolute: 0.1 10*3/uL (ref 0.0–0.1)
Basophils Relative: 1 % (ref 0.0–3.0)
Eosinophils Absolute: 0.1 10*3/uL (ref 0.0–0.7)
Eosinophils Relative: 1.1 % (ref 0.0–5.0)
HCT: 40.6 % (ref 36.0–46.0)
Hemoglobin: 13.3 g/dL (ref 12.0–15.0)
Lymphocytes Relative: 27.4 % (ref 12.0–46.0)
Lymphs Abs: 1.8 10*3/uL (ref 0.7–4.0)
MCHC: 32.8 g/dL (ref 30.0–36.0)
MCV: 88.8 fl (ref 78.0–100.0)
Monocytes Absolute: 0.4 10*3/uL (ref 0.1–1.0)
Monocytes Relative: 6.1 % (ref 3.0–12.0)
Neutro Abs: 4.2 10*3/uL (ref 1.4–7.7)
Neutrophils Relative %: 64.4 % (ref 43.0–77.0)
Platelets: 284 10*3/uL (ref 150.0–400.0)
RBC: 4.58 Mil/uL (ref 3.87–5.11)
RDW: 12.8 % (ref 11.5–15.5)
WBC: 6.5 10*3/uL (ref 4.0–10.5)

## 2023-04-03 LAB — LIPID PANEL
Cholesterol: 210 mg/dL — ABNORMAL HIGH (ref 0–200)
HDL: 74 mg/dL (ref >39.00)
LDL Cholesterol: 124 mg/dL — ABNORMAL HIGH (ref 0–99)
NonHDL: 135.95
Total CHOL/HDL Ratio: 3
Triglycerides: 62 mg/dL (ref 0.0–149.0)
VLDL: 12.4 mg/dL (ref 0.0–40.0)

## 2023-04-03 LAB — TSH: TSH: 3.21 u[IU]/mL (ref 0.35–5.50)

## 2023-04-10 ENCOUNTER — Ambulatory Visit (INDEPENDENT_AMBULATORY_CARE_PROVIDER_SITE_OTHER): Payer: BC Managed Care – PPO | Admitting: Family Medicine

## 2023-04-10 ENCOUNTER — Encounter: Payer: Self-pay | Admitting: Family Medicine

## 2023-04-10 VITALS — BP 124/74 | HR 54 | Temp 97.9°F | Ht 64.96 in | Wt 168.4 lb

## 2023-04-10 DIAGNOSIS — Z0000 Encounter for general adult medical examination without abnormal findings: Secondary | ICD-10-CM

## 2023-04-10 DIAGNOSIS — G43009 Migraine without aura, not intractable, without status migrainosus: Secondary | ICD-10-CM

## 2023-04-10 DIAGNOSIS — Z7189 Other specified counseling: Secondary | ICD-10-CM

## 2023-04-10 DIAGNOSIS — Z1211 Encounter for screening for malignant neoplasm of colon: Secondary | ICD-10-CM

## 2023-04-10 DIAGNOSIS — R109 Unspecified abdominal pain: Secondary | ICD-10-CM

## 2023-04-10 MED ORDER — SUMATRIPTAN SUCCINATE 100 MG PO TABS: ORAL_TABLET | ORAL | 12 refills | Status: AC

## 2023-04-10 NOTE — Progress Notes (Signed)
 CPE- See plan.  Routine anticipatory guidance given to patient.  See health maintenance.  The possibility exists that previously documented standard health maintenance information may have been brought forward from a previous encounter into this note.  If needed, that same information has been updated to reflect the current situation based on today's encounter.    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   DXA not due.   Mammogram 2024 Pap per gyn No menses.   Cologuard neg 2022.  Reordered 2025.  Living will d/w pt. Husband designated if patient were incapacitated.   Diet and exercise d/w pt.   I reviewed the note about her keflex allergy.  Would still list as an allergy.   Abd discomfort.  Started taking a probiotic, some improvement.  Rumbling and gas, more at night.  No bloody or black stools.  Neg cologuard 2022.  No vomiting.  Hasn't tried gas x, d/w pt about trying that at night if needed.   Migraines.  Less severe than prev.  Using imitrex prn.  It helps some.  More effect if used early in the HA.    Her father died and she is still caring for her mother.  D/w pt.  I thanked her for her effort.    PMH and SH reviewed  Meds, vitals, and allergies reviewed.   ROS: Per HPI.  Unless specifically indicated otherwise in HPI, the patient denies:  General: fever. Eyes: acute vision changes ENT: sore throat Cardiovascular: chest pain Respiratory: SOB GI: vomiting GU: dysuria Musculoskeletal: acute back pain Derm: acute rash Neuro: acute motor dysfunction Psych: worsening mood Endocrine: polydipsia Heme: bleeding Allergy: hayfever  GEN: nad, alert and oriented HEENT: ncat NECK: supple w/o LA CV: rrr. PULM: ctab, no inc wob ABD: soft, +bs, not ttp EXT: no edema SKIN: no acute rash  The 10-year ASCVD risk score (Arnett DK, et al., 2019) is: 2.3%   Values used to calculate the score:     Age: 58  years     Sex: Female     Is Non-Hispanic African American: No     Diabetic: No     Tobacco smoker: No     Systolic Blood Pressure: 124 mmHg     Is BP treated: No     HDL Cholesterol: 74 mg/dL     Total Cholesterol: 210 mg/dL

## 2023-04-10 NOTE — Patient Instructions (Signed)
 Let me check on a low FODMAP diet.  Okay to try gas x at night.  Update me as needed.  Take care.  Glad to see you.

## 2023-04-12 ENCOUNTER — Telehealth: Payer: Self-pay | Admitting: Family Medicine

## 2023-04-12 DIAGNOSIS — R109 Unspecified abdominal pain: Secondary | ICD-10-CM | POA: Insufficient documentation

## 2023-04-12 NOTE — Assessment & Plan Note (Signed)
 Hasn't tried gas x, d/w pt about trying that at night if needed. She can update me as needed.

## 2023-04-12 NOTE — Assessment & Plan Note (Signed)
 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   DXA not due.   Mammogram 2024 Pap per gyn No menses.   Cologuard neg 2022.  Reordered 2025.  Living will d/w pt. Husband designated if patient were incapacitated.   Diet and exercise d/w pt.

## 2023-04-12 NOTE — Telephone Encounter (Signed)
 I printed a FODMAP diet for patient.    [email or web address]  Please pull it for me and send to patient.  If it didn't print, please let me know.  Thanks.

## 2023-04-12 NOTE — Assessment & Plan Note (Signed)
Living will d/w pt.  Husband designated if patient were incapacitated.  

## 2023-04-12 NOTE — Assessment & Plan Note (Signed)
 Less severe than prev.  Using imitrex prn.  It helps some.  More effect if used early in the HA.  Continue as is.

## 2023-04-13 ENCOUNTER — Telehealth: Admitting: Physician Assistant

## 2023-04-13 DIAGNOSIS — R3989 Other symptoms and signs involving the genitourinary system: Secondary | ICD-10-CM | POA: Diagnosis not present

## 2023-04-13 MED ORDER — CIPROFLOXACIN HCL 500 MG PO TABS: 500.0000 mg | ORAL_TABLET | Freq: Two times a day (BID) | ORAL | 0 refills | Status: AC

## 2023-04-13 NOTE — Progress Notes (Signed)
 E-Visit for Urinary Problems  We are sorry that you are not feeling well.  Here is how we plan to help!  Based on what you shared with me it looks like you most likely have a simple urinary tract infection.  A UTI (Urinary Tract Infection) is a bacterial infection of the bladder.  Most cases of urinary tract infections are simple to treat but a key part of your care is to encourage you to drink plenty of fluids and watch your symptoms carefully.  I have prescribed Cipro 500mg  twice a day for 5 days.  Your symptoms should gradually improve. Call us if the burning in your urine worsens, you develop worsening fever, back pain or pelvic pain or if your symptoms do not resolve after completing the antibiotic.  Urinary tract infections can be prevented by drinking plenty of water to keep your body hydrated.  Also be sure when you wipe, wipe from front to back and don't hold it in!  If possible, empty your bladder every 4 hours.  HOME CARE Drink plenty of fluids Compete the full course of the antibiotics even if the symptoms resolve Remember, when you need to go.go. Holding in your urine can increase the likelihood of getting a UTI! GET HELP RIGHT AWAY IF: You cannot urinate You get a high fever Worsening back pain occurs You see blood in your urine You feel sick to your stomach or throw up You feel like you are going to pass out  MAKE SURE YOU  Understand these instructions. Will watch your condition. Will get help right away if you are not doing well or get worse.   Thank you for choosing an e-visit.  Your e-visit answers were reviewed by a board certified advanced clinical practitioner to complete your personal care plan. Depending upon the condition, your plan could have included both over the counter or prescription medications.  Please review your pharmacy choice. Make sure the pharmacy is open so you can pick up prescription now. If there is a problem, you may contact your  provider through [employer] and have the prescription routed to another pharmacy.  Your safety is important to [place]. If you have drug allergies check your prescription carefully.   For the next 24 hours you can use MyChart to ask questions about today's visit, request a non-urgent call back, or ask for a work or school excuse. You will get an email in the next two days asking about your experience. I hope that your e-visit has been valuable and will speed your recovery.   I have spent 5 minutes in review of e-visit questionnaire, review and updating patient chart, medical decision making and response to patient.   Tylene Fantasia Ward, PA-C

## 2023-04-14 NOTE — Telephone Encounter (Signed)
 Mailed to patient

## 2023-09-16 DIAGNOSIS — Z1211 Encounter for screening for malignant neoplasm of colon: Secondary | ICD-10-CM | POA: Diagnosis not present

## 2023-09-23 ENCOUNTER — Ambulatory Visit: Payer: Self-pay | Admitting: Family Medicine

## 2023-09-23 LAB — COLOGUARD: COLOGUARD: NEGATIVE

## 2024-01-21 ENCOUNTER — Telehealth: Admitting: Physician Assistant

## 2024-01-21 DIAGNOSIS — R3989 Other symptoms and signs involving the genitourinary system: Secondary | ICD-10-CM | POA: Diagnosis not present

## 2024-01-21 MED ORDER — CIPROFLOXACIN HCL 500 MG PO TABS: 500.0000 mg | ORAL_TABLET | Freq: Two times a day (BID) | ORAL | 0 refills | Status: AC

## 2024-01-21 NOTE — Progress Notes (Signed)
 We are sorry that you are not feeling well.  Here is how we plan to help!  Based on what you shared with me it looks like you most likely have a simple urinary tract infection.  A UTI (Urinary Tract Infection) is a bacterial infection of the bladder.  Most cases of urinary tract infections are simple to treat but a key part of your care is to encourage you to drink plenty of fluids and watch your symptoms carefully.  I have prescribed Cipro  500 mg twice daily for 5 days. Your symptoms should gradually improve. Call us  if the burning in your urine worsens, you develop worsening fever, back pain or pelvic pain or if your symptoms do not resolve after completing the antibiotic.  Urinary tract infections can be prevented by drinking plenty of water to keep your body hydrated.  Also be sure when you wipe, wipe from front to back and don't hold it in!  If possible, empty your bladder every 4 hours.  Your e-visit answers were reviewed by a board certified advanced clinical practitioner to complete your personal care plan.  Depending on the condition, your plan could have included both over the counter or prescription medications.  If there is a problem please reply  once you have received a response from your provider.  Your safety is important to us .  If you have drug allergies check your prescription carefully.    You can use MyChart to ask questions about todays visit, request a non-urgent call back, or ask for a work or school excuse for 24 hours related to this e-Visit. If it has been greater than 24 hours you will need to follow up with your provider, or enter a new e-Visit to address those concerns.   You will get an e-mail in the next two days asking about your experience.  I hope that your e-visit has been valuable and will speed your recovery. Thank you for using e-visits.  I have spent 5 minutes in review of e-visit questionnaire, review and updating patient chart, medical decision making  and response to patient.   Elsie Velma Lunger, PA-C
